# Patient Record
Sex: Male | Born: 1980 | Race: White | Hispanic: No | Marital: Single | Smoking: Current every day smoker
Health system: Southern US, Community
[De-identification: ages and names within clinical notes are randomized; demographics above are authoritative.]

## PROBLEM LIST (undated history)

## (undated) DIAGNOSIS — K5190 Ulcerative colitis, unspecified, without complications: Secondary | ICD-10-CM

## (undated) HISTORY — DX: Ulcerative colitis, unspecified, without complications: K51.90

---

## 2017-04-22 ENCOUNTER — Inpatient Hospital Stay (HOSPITAL_COMMUNITY)
Admission: AD | Admit: 2017-04-22 | Discharge: 2017-04-28 | DRG: 885 | Disposition: A | Discharge status: Home / Self Care | Payer: No Typology Code available for payment source | Source: Other Acute Inpatient Hospital | Attending: Psychiatry | Admitting: Psychiatry

## 2017-04-22 ENCOUNTER — Encounter (HOSPITAL_COMMUNITY): Payer: Self-pay

## 2017-04-22 DIAGNOSIS — F17210 Nicotine dependence, cigarettes, uncomplicated: Secondary | ICD-10-CM | POA: Diagnosis present

## 2017-04-22 DIAGNOSIS — Z79899 Other long term (current) drug therapy: Secondary | ICD-10-CM

## 2017-04-22 DIAGNOSIS — F410 Panic disorder [episodic paroxysmal anxiety] without agoraphobia: Secondary | ICD-10-CM | POA: Diagnosis present

## 2017-04-22 DIAGNOSIS — F322 Major depressive disorder, single episode, severe without psychotic features: Secondary | ICD-10-CM | POA: Diagnosis present

## 2017-04-22 DIAGNOSIS — R45851 Suicidal ideations: Secondary | ICD-10-CM | POA: Diagnosis present

## 2017-04-22 DIAGNOSIS — F333 Major depressive disorder, recurrent, severe with psychotic symptoms: Secondary | ICD-10-CM | POA: Diagnosis not present

## 2017-04-22 DIAGNOSIS — F19959 Other psychoactive substance use, unspecified with psychoactive substance-induced psychotic disorder, unspecified: Secondary | ICD-10-CM | POA: Diagnosis present

## 2017-04-22 DIAGNOSIS — F1994 Other psychoactive substance use, unspecified with psychoactive substance-induced mood disorder: Secondary | ICD-10-CM | POA: Diagnosis not present

## 2017-04-22 LAB — LIPID PANEL
CHOL/HDL RATIO: 4.3 ratio
Cholesterol: 169 mg/dL (ref 0–200)
HDL: 39 mg/dL — ABNORMAL LOW (ref >40)
LDL CALC: 96 mg/dL (ref 0–99)
Triglycerides: 171 mg/dL — ABNORMAL HIGH (ref <150)
VLDL: 34 mg/dL (ref 0–40)

## 2017-04-22 LAB — TSH: TSH: 8.997 u[IU]/mL — AB (ref 0.350–4.500)

## 2017-04-22 MED ORDER — ARIPIPRAZOLE 2 MG PO TABS
2.0000 mg | ORAL_TABLET | Freq: Every day | ORAL | Status: DC
  Administered 2017-04-22: 2 mg via ORAL
  Filled 2017-04-22 (×3): qty 1

## 2017-04-22 MED ORDER — LORAZEPAM 1 MG PO TABS
1.0000 mg | ORAL_TABLET | Freq: Every day | ORAL | Status: AC
  Administered 2017-04-25: 1 mg via ORAL
  Filled 2017-04-22: qty 1

## 2017-04-22 MED ORDER — LOPERAMIDE HCL 2 MG PO CAPS: 2.0000 mg | ORAL_CAPSULE | ORAL | Status: AC | PRN

## 2017-04-22 MED ORDER — ONDANSETRON 4 MG PO TBDP: 4.0000 mg | ORAL_TABLET | Freq: Four times a day (QID) | ORAL | Status: AC | PRN

## 2017-04-22 MED ORDER — ALUM & MAG HYDROXIDE-SIMETH 200-200-20 MG/5ML PO SUSP: 30.0000 mL | ORAL | Status: DC | PRN

## 2017-04-22 MED ORDER — SERTRALINE HCL 25 MG PO TABS
25.0000 mg | ORAL_TABLET | Freq: Every day | ORAL | Status: DC
  Administered 2017-04-22: 25 mg via ORAL
  Filled 2017-04-22 (×3): qty 1

## 2017-04-22 MED ORDER — ACETAMINOPHEN 325 MG PO TABS
650.0000 mg | ORAL_TABLET | Freq: Four times a day (QID) | ORAL | Status: DC | PRN
  Administered 2017-04-23: 650 mg via ORAL
  Filled 2017-04-22: qty 2

## 2017-04-22 MED ORDER — LORAZEPAM 1 MG PO TABS
1.0000 mg | ORAL_TABLET | Freq: Four times a day (QID) | ORAL | Status: AC
  Administered 2017-04-22 (×4): 1 mg via ORAL
  Filled 2017-04-22 (×3): qty 1

## 2017-04-22 MED ORDER — MAGNESIUM HYDROXIDE 400 MG/5ML PO SUSP: 30.0000 mL | Freq: Every day | ORAL | Status: DC | PRN

## 2017-04-22 MED ORDER — VITAMIN B-1 100 MG PO TABS
100.0000 mg | ORAL_TABLET | Freq: Every day | ORAL | Status: DC
  Administered 2017-04-23 – 2017-04-28 (×6): 100 mg via ORAL
  Filled 2017-04-22 (×8): qty 1

## 2017-04-22 MED ORDER — LORAZEPAM 1 MG PO TABS: 1.0000 mg | ORAL_TABLET | Freq: Three times a day (TID) | ORAL | Status: DC

## 2017-04-22 MED ORDER — LORAZEPAM 1 MG PO TABS
1.0000 mg | ORAL_TABLET | Freq: Four times a day (QID) | ORAL | Status: AC | PRN
  Administered 2017-04-22 – 2017-04-23 (×2): 1 mg via ORAL
  Filled 2017-04-22 (×3): qty 1

## 2017-04-22 MED ORDER — ADULT MULTIVITAMIN W/MINERALS CH
1.0000 | ORAL_TABLET | Freq: Every day | ORAL | Status: DC
  Administered 2017-04-22 – 2017-04-28 (×7): 1 via ORAL
  Filled 2017-04-22 (×11): qty 1

## 2017-04-22 MED ORDER — LORAZEPAM 1 MG PO TABS: 1.0000 mg | ORAL_TABLET | Freq: Every day | ORAL | Status: DC

## 2017-04-22 MED ORDER — SERTRALINE HCL 50 MG PO TABS
50.0000 mg | ORAL_TABLET | Freq: Every day | ORAL | Status: DC
  Administered 2017-04-23 – 2017-04-28 (×6): 50 mg via ORAL
  Filled 2017-04-22 (×2): qty 1
  Filled 2017-04-22: qty 7
  Filled 2017-04-22 (×5): qty 1

## 2017-04-22 MED ORDER — HYDROXYZINE HCL 25 MG PO TABS
25.0000 mg | ORAL_TABLET | Freq: Four times a day (QID) | ORAL | Status: DC | PRN
  Administered 2017-04-22 (×2): 25 mg via ORAL
  Filled 2017-04-22 (×2): qty 1

## 2017-04-22 MED ORDER — LORAZEPAM 1 MG PO TABS: 1.0000 mg | ORAL_TABLET | Freq: Two times a day (BID) | ORAL | Status: DC

## 2017-04-22 MED ORDER — LORAZEPAM 1 MG PO TABS: 1.0000 mg | ORAL_TABLET | Freq: Four times a day (QID) | ORAL | Status: DC

## 2017-04-22 MED ORDER — LORAZEPAM 1 MG PO TABS
1.0000 mg | ORAL_TABLET | Freq: Two times a day (BID) | ORAL | Status: AC
  Administered 2017-04-24 (×2): 1 mg via ORAL
  Filled 2017-04-22 (×3): qty 1

## 2017-04-22 MED ORDER — NICOTINE 21 MG/24HR TD PT24
21.0000 mg | MEDICATED_PATCH | Freq: Every day | TRANSDERMAL | Status: DC
  Administered 2017-04-23 – 2017-04-28 (×6): 21 mg via TRANSDERMAL
  Filled 2017-04-22 (×9): qty 1

## 2017-04-22 MED ORDER — LORAZEPAM 1 MG PO TABS
1.0000 mg | ORAL_TABLET | Freq: Three times a day (TID) | ORAL | Status: AC
  Administered 2017-04-23 (×3): 1 mg via ORAL
  Filled 2017-04-22 (×2): qty 1

## 2017-04-22 MED ORDER — ARIPIPRAZOLE 5 MG PO TABS
5.0000 mg | ORAL_TABLET | Freq: Every day | ORAL | Status: DC
  Administered 2017-04-23: 5 mg via ORAL
  Filled 2017-04-22 (×3): qty 1

## 2017-04-22 MED ORDER — TRAZODONE HCL 50 MG PO TABS
50.0000 mg | ORAL_TABLET | Freq: Every evening | ORAL | Status: DC | PRN
  Administered 2017-04-22 – 2017-04-23 (×3): 50 mg via ORAL
  Filled 2017-04-22 (×3): qty 1

## 2017-04-22 NOTE — BH Assessment (Signed)
Tele Assessment Note   Carl Mcdaniel is an 36 y.o. male, African American Patient states primary concern is he needs help but has not really sought help before. Patient states depression has become worse and is waking up with no sense of purpose or will to keep going. Patient when asked about SI  plans stated that he just had not had the right tools. Patient also has hx. of multiple stressors/trauma including a friend x 2 years ago that commited suicide. Patient was paranoid apparently at first during assessment, then agreed to continue and was standing outside room door and not very still when asked did state he has had a a lot happen to him in the past and childhood hx. of depression as well. Patient acknowledges current SI but no specific plan although did make statements such as  "I feel like I am done." Patient also states recent panic and anxiety attacks last x 1 week ago random in nature. Patient denies current HI and AVH. Patient denies hx. of inpatient psych care and is not seen outpatient.  Patient was dressed in scrubs speech appeared normal, movement was restless and thought process was clear with recent and remote memory intact. Patient did not appear to be responding to internal stimuli. Patient was cooperative and states he is agreeable to inpatient psych care. Patient does states hx. of S.A. with alcohol first use at age 57 x 2, since 2015/16 has started drinking more daily with 12 pack or more last consumption today with 4 beers or more.Patient denies hx. fo DT' s and/or seizures.   Diagnosis: Major Depressive Disorder, Alcohol Use Disorder, Severe  Past Medical History: History reviewed. No pertinent past medical history.  History reviewed. No pertinent surgical history.  Family History: History reviewed. No pertinent family history.  Social History:  reports that he has been smoking Cigarettes.  He has been smoking about 1.50 packs per day. He has never used smokeless tobacco.  He reports that he drinks alcohol. He reports that he uses drugs, including Amphetamines.  Additional Social History:  Alcohol / Drug Use Pain Medications: SEE MAR Prescriptions: SEE MAR Over the Counter: SEE MAR History of alcohol / drug use?: Yes Longest period of sobriety (when/how long): unknown Negative Consequences of Use: Financial, Legal, Personal relationships Withdrawal Symptoms: Patient aware of relationship between substance abuse and physical/medical complications Substance #1 Name of Substance 1: alcohol 1 - Age of First Use: unspecified 1 - Amount (size/oz): 12 pack beer or more 1 - Frequency: daily 1 - Duration: years 1 - Last Use / Amount: 04/21/17 4 beers or more  CIWA: CIWA-Ar BP: 126/77 Pulse Rate: 96 Nausea and Vomiting: no nausea and no vomiting Tactile Disturbances: none Tremor: two Auditory Disturbances: moderately severe hallucinations Paroxysmal Sweats: no sweat visible Visual Disturbances: moderate sensitivity Anxiety: two Headache, Fullness in Head: none present Agitation: somewhat more than normal activity Orientation and Clouding of Sensorium: cannot do serial additions or is uncertain about date CIWA-Ar Total: 13 COWS:    PATIENT STRENGTHS: (choose at least two) Active sense of humor Average or above average intelligence  Allergies:  Allergies  Allergen Reactions  . Other     Shell fish    Home Medications:  No prescriptions prior to admission.    OB/GYN Status:  No LMP for male patient.  General Assessment Data [facility] of Assessment[facility] TTS Assessment: Out of system Is this a Tele or Face-to-Face Assessment?: Tele Assessment Is this an Initial Assessment or  a Re-assessment for this encounter?: Initial Assessment Marital status: Single Maiden name: n/a Is patient pregnant?: No Pregnancy Status: No Living Arrangements: Other (Comment) (pt. states homeless but currently resides with friends) Can pt return  to current living arrangement?: Yes Admission Status: Voluntary Is patient capable of signing voluntary admission?: Yes Referral Source: Other Insurance type: Medicaid     Crisis Care Plan Living Arrangements: Other (Comment) (pt. states homeless but currently resides with friends) Name of Psychiatrist: none Name of Therapist: none  Education Status Is patient currently in school?: No Current Grade: n/a Highest grade of school patient has completed: 10th Name of school: n/a Contact person: none given  Risk to self with the past 6 months Suicidal Ideation: Yes-Currently Present Has patient been a risk to self within the past 6 months prior to admission? : No Suicidal Intent: No Has patient had any suicidal intent within the past 6 months prior to admission? : No Is patient at risk for suicide?: Yes Suicidal Plan?: No Has patient had any suicidal plan within the past 6 months prior to admission? : No Access to Means: No What has been your use of drugs/alcohol within the last 12 months?: alcohol Previous Attempts/Gestures: No How many times?: 0 Other Self Harm Risks: none  Triggers for Past Attempts: Unknown Intentional Self Injurious Behavior: None Family Suicide History: No Recent stressful life event(s): Turmoil (Comment) Persecutory voices/beliefs?: No Depression: Yes Depression Symptoms: Insomnia, Tearfulness, Isolating, Fatigue, Guilt, Loss of interest in usual pleasures, Feeling worthless/self pity Substance abuse history and/or treatment for substance abuse?: Yes Suicide prevention information given to non-admitted patients: Yes  Risk to Others within the past 6 months Homicidal Ideation: No Does patient have any lifetime risk of violence toward others beyond the six months prior to admission? : No Thoughts of Harm to Others: No Current Homicidal Intent: No Current Homicidal Plan: No Access to Homicidal Means: No Identified Victim: none History of harm to  others?: No Assessment of Violence: None Noted Violent Behavior Description: n/a Does patient have access to weapons?: No Criminal Charges Pending?: No Does patient have a court date: No Is patient on probation?: No  Psychosis Hallucinations: None noted Delusions: None noted  Mental Status Report Appearance/Hygiene: In scrubs Eye Contact: Fair Motor Activity: Restlessness Speech: Logical/coherent Level of Consciousness: Alert Mood: Anxious Affect: Depressed Anxiety Level: Moderate Thought Processes: Relevant Judgement: Impaired Orientation: Person, Place, Time, Situation, Appropriate for developmental age Obsessive Compulsive Thoughts/Behaviors: Moderate  Cognitive Functioning Concentration: Normal Memory: Recent Intact, Remote Intact IQ: Average Insight: Fair Impulse Control: Poor Appetite: Fair Weight Loss: 0 Weight Gain: 0 Sleep: Decreased Total Hours of Sleep: 4 Vegetative Symptoms: None  ADLScreening [facility] Assessment [facility]) Patient's cognitive ability adequate to safely complete daily activities?: Yes Patient able to express need for assistance with ADLs?: Yes Independently performs ADLs?: Yes (appropriate for developmental age)  Prior Inpatient Therapy Prior Inpatient Therapy: No Prior Therapy Dates: n/a Prior Therapy Facilty/Provider(s): n/a Reason for Treatment: n/a  Prior Outpatient Therapy Prior Outpatient Therapy: No Prior Therapy Dates: n/a Prior Therapy Facilty/Provider(s): n/a Reason for Treatment: n/a Does patient have an ACCT team?: No Does patient have Intensive In-House Services?  : No Does patient have [facility] services? : No Does patient have P4CC services?: No  ADL Screening (condition at time of admission) Patient's cognitive ability adequate to safely complete daily activities?: Yes Is the patient deaf or have difficulty hearing?: No Does the patient have difficulty seeing, even when wearing glasses/contacts?: No Does the patient  have  difficulty concentrating, remembering, or making decisions?: No Patient able to express need for assistance with ADLs?: Yes Does the patient have difficulty dressing or bathing?: No Independently performs ADLs?: Yes (appropriate for developmental age) Does the patient have difficulty walking or climbing stairs?: No Weakness of Legs: None Weakness of Arms/Hands: None  Home Assistive Devices/Equipment Home Assistive Devices/Equipment: None  Therapy Consults (therapy consults require a physician order) PT Evaluation Needed: No OT Evalulation Needed: No SLP Evaluation Needed: No Abuse/Neglect Assessment (Assessment to be complete while patient is alone) Physical Abuse: Denies Verbal Abuse: Denies Sexual Abuse: Denies Exploitation of patient/patient's resources: Denies Self-Neglect: Denies Values / Beliefs Cultural Requests During Hospitalization: None Spiritual Requests During Hospitalization: None Consults Spiritual Care Consult Needed: No Social Work Consult Needed: No Merchant navy officerAdvance Directives (For Healthcare) Does Patient Have a Medical Advance Directive?: No Would patient like information on creating a medical advance directive?: No - Patient declined Nutrition Screen- [facility] Adult/[facility]/AP Patient's home diet: Regular, Full liquid Has the patient recently lost weight without trying?: No Has the patient been eating poorly because of a decreased appetite?: No Malnutrition Screening Tool Score: 0  Additional Information 1:1 In Past 12 Months?: No CIRT Risk: No Elopement Risk: No Does patient have medical clearance?: Yes     Disposition: Per Nira ConnJason Berry, NP meets inpatient criteria Disposition Initial Assessment Completed for this Encounter: Yes Disposition of Patient: Inpatient treatment program Type of inpatient treatment program: Adult  Hipolito BayleyShean K Kathrynne Kulinski 04/22/2017 3:39 AM

## 2017-04-22 NOTE — [facility] Group Notes (Signed)
[facility] LCSW Group Therapy Note  04/22/2017 at 10:15 AM  Type of Therapy and Topic:  Group Therapy: Avoiding Self-Sabotaging and Enabling Behaviors  Participation Level:  Did Not Attend; invited to participate yet did not despite overhead announcement and encouragement by staff    Joannah Gitlin C Tim Corriher, LCSW 

## 2017-04-22 NOTE — H&P (Signed)
Psychiatric Admission Assessment Adult  Patient Identification: Carl Mcdaniel MRN:  [id number] Date of Evaluation:  04/22/2017 Chief Complaint:  MDD ETOH USE DISORDER,SEV COCAINE USE DISORDER Principal Diagnosis: Severe episode of recurrent major depressive disorder, with psychotic features (HCC) Diagnosis:   Patient Active Problem List   Diagnosis Date Noted  . Severe major depression without psychotic features (HCC) [F32.2] 04/22/2017  . Severe episode of recurrent major depressive disorder, with psychotic features (HCC) [F33.3]    History of Present Illness:Per tele- assessment note-Carl Mcdaniel is an 36 y.o. male, African American Patient states primary concern is he needs help but has not really sought help before. Patient states depression has become worse and is waking up with no sense of purpose or will to keep going. Patient when asked about SI  plans stated that he just had not had the right tools. Patient also has hx. of multiple stressors/trauma including a friend x 2 years ago that commited suicide. Patient was paranoid apparently at first during assessment, then agreed to continue and was standing outside room door and not very still when asked did state he has had a a lot happen to him in the past and childhood hx. of depression as well. Patient acknowledges current SI but no specific plan although did make statements such as  "I feel like I am done." Patient also states recent panic and anxiety attacks last x 1 week ago random in nature. Patient denies current HI and AVH. Patient denies hx. of inpatient psych care and is not seen outpatient.  Patient was dressed in scrubs speech appeared normal, movement was restless and thought process was clear with recent and remote memory intact. Patient did not appear to be responding to internal stimuli. Patient was cooperative and states he is agreeable to inpatient psych care. Patient does states hx. of S.A. with alcohol first use at  age 61 x 2, since 2015/16 has started drinking more daily with 12 pack or more last consumption today with 4 beers or more.Patient denies hx. fo DT' s and/or seizures Associated Signs/Symptoms: Depression Symptoms:  depressed mood, (Hypo) Manic Symptoms:  Distractibility, Anxiety Symptoms:  Excessive Worry, Social Anxiety, Psychotic Symptoms:  Hallucinations: Auditory Visual PTSD Symptoms: Hypervigilance:  Yes Total Time spent with patient: 30 minutes  Past Psychiatric History:   Is the patient at risk to self? Yes.    Has the patient been a risk to self in the past 6 months? Yes.    Has the patient been a risk to self within the distant past? Yes.    Is the patient a risk to others? No.  Has the patient been a risk to others in the past 6 months? No.  Has the patient been a risk to others within the distant past? No.   Prior Inpatient Therapy: Prior Inpatient Therapy: No Prior Therapy Dates: n/a Prior Therapy Facilty/Provider(s): n/a Reason for Treatment: n/a Prior Outpatient Therapy: Prior Outpatient Therapy: No Prior Therapy Dates: n/a Prior Therapy Facilty/Provider(s): n/a Reason for Treatment: n/a Does patient have an ACCT team?: No Does patient have Intensive In-House Services?  : No Does patient have [facility] services? : No Does patient have P4CC services?: No  Alcohol Screening: 1. How often do you have a drink containing alcohol?: 4 or more times a week 2. How many drinks containing alcohol do you have on a typical day when you are drinking?: 3 or 4 3. How often do you have six or more drinks on one occasion?: Daily  or almost daily Preliminary Score: 5 4. How often during the last year have you found that you were not able to stop drinking once you had started?: Daily or almost daily 5. How often during the last year have you failed to do what was normally expected from you becasue of drinking?: Never 6. How often during the last year have you needed a first drink in  the morning to get yourself going after a heavy drinking session?: Less than monthly 7. How often during the last year have you had a feeling of guilt of remorse after drinking?: Daily or almost daily 8. How often during the last year have you been unable to remember what happened the night before because you had been drinking?: Never 9. Have you or someone else been injured as a result of your drinking?: No 10. Has a relative or friend or a doctor or another health worker been concerned about your drinking or suggested you cut down?: No Alcohol Use Disorder Identification Test Final Score (AUDIT): 18 Substance Abuse History in the last 12 months:  Yes.   Consequences of Substance Abuse: NA Previous Psychotropic Medications: YES Psychological Evaluations: YES Past Medical History: History reviewed. No pertinent past medical history. History reviewed. No pertinent surgical history. Family History: History reviewed. No pertinent family history. Family Psychiatric  History:  Tobacco Screening: Have you used any form of tobacco in the last 30 days? (Cigarettes, Smokeless Tobacco, Cigars, and/or Pipes): Yes Tobacco use, Select all that apply: 5 or more cigarettes per day Are you interested in Tobacco Cessation Medications?: No, patient refused Counseled patient on smoking cessation including recognizing danger situations, developing coping skills and basic information about quitting provided: Refused/Declined practical counseling Social History:  History  Alcohol Use  . Yes     History  Drug Use  . Types: Amphetamines    Additional Social History: Marital status: Single Are you sexually active?: No What is your sexual orientation?: Unknown Has your sexual activity been affected by drugs, alcohol, medication, or emotional stress?: Unknown Does patient have children?: No    Pain Medications: SEE MAR Prescriptions: SEE MAR Over the Counter: SEE MAR History of alcohol / drug use?:  Yes Longest period of sobriety (when/how long): unknown Negative Consequences of Use: Financial, Legal, Personal relationships Withdrawal Symptoms: Patient aware of relationship between substance abuse and physical/medical complications Name of Substance 1: alcohol 1 - Age of First Use: unspecified 1 - Amount (size/oz): 12 pack beer or more 1 - Frequency: daily 1 - Duration: years 1 - Last Use / Amount: 04/21/17 4 beers or more                  Allergies:   Allergies  Allergen Reactions  . Other     Shell fish   Lab Results:  Results for orders placed or performed during the hospital encounter of 04/22/17 (from the past 48 hour(s))  Lipid panel     Status: Abnormal   Collection Time: 04/22/17  6:26 AM  Result Value Ref Range   Cholesterol 169 0 - 200 mg/dL   Triglycerides 161 (H) <150 mg/dL   HDL 39 (L) >09 mg/dL   Total CHOL/HDL Ratio 4.3 RATIO   VLDL 34 0 - 40 mg/dL   LDL Cholesterol 96 0 - 99 mg/dL    Comment:        Total Cholesterol/HDL:CHD Risk Coronary Heart Disease Risk Table  Men   Women  1/2 Average Risk   3.4   3.3  Average Risk       5.0   4.4  2 X Average Risk   9.6   7.1  3 X Average Risk  23.4   11.0        Use the calculated Patient Ratio above and the CHD Risk Table to determine the patient's CHD Risk.        ATP III CLASSIFICATION (LDL):  <100     mg/dL   Optimal  [id number]  mg/dL   Near or Above                    Optimal  130-159  mg/dL   Borderline  [id number]  mg/dL   High  >295>190     mg/dL   Very High Performed at [facility], [address] [place], [place] [address]   TSH     Status: Abnormal   Collection Time: 04/22/17  6:26 AM  Result Value Ref Range   TSH 8.997 (H) 0.350 - 4.500 uIU/mL    Comment: Performed by a 3rd Generation assay with a functional sensitivity of <=0.01 uIU/mL. Performed at [facility], [address] [place], [place] [address]     Blood Alcohol level:  No results  found for: [facility]  Metabolic Disorder Labs:  No results found for: HGBA1C, MPG No results found for: PROLACTIN Lab Results  Component Value Date   CHOL 169 04/22/2017   TRIG 171 (H) 04/22/2017   HDL 39 (L) 04/22/2017   CHOLHDL 4.3 04/22/2017   VLDL 34 04/22/2017   LDLCALC 96 04/22/2017    Current Medications: Current Facility-Administered Medications  Medication Dose Route Frequency Provider Last Rate Last Dose  . acetaminophen (TYLENOL) tablet 650 mg  650 mg Oral Q6H PRN Jackelyn PolingBerry, Jason A, NP      . alum & mag hydroxide-simeth (MAALOX/MYLANTA) 200-200-20 MG/5ML suspension 30 mL  30 mL Oral Q4H PRN Jackelyn PolingBerry, Jason A, NP      . Melene Muller[START ON 04/23/2017] ARIPiprazole (ABILIFY) tablet 5 mg  5 mg Oral Daily [name], Fernando A, MD      . hydrOXYzine (ATARAX/VISTARIL) tablet 25 mg  25 mg Oral Q6H PRN Nira ConnBerry, Jason A, NP   25 mg at 04/22/17 0148  . loperamide (IMODIUM) capsule 2-4 mg  2-4 mg Oral PRN Nira ConnBerry, Jason A, NP      . LORazepam (ATIVAN) tablet 1 mg  1 mg Oral Q6H PRN Nira ConnBerry, Jason A, NP   1 mg at 04/22/17 0148  . [START ON 04/24/2017] LORazepam (ATIVAN) tablet 1 mg  1 mg Oral BID Jackelyn PolingBerry, Jason A, NP       Followed by  . LORazepam (ATIVAN) tablet 1 mg  1 mg Oral QID Nira ConnBerry, Jason A, NP   1 mg at 04/22/17 1502   Followed by  . [START ON 04/23/2017] LORazepam (ATIVAN) tablet 1 mg  1 mg Oral TID Jackelyn PolingBerry, Jason A, NP       Followed by  . [START ON 04/25/2017] LORazepam (ATIVAN) tablet 1 mg  1 mg Oral Daily Nira ConnBerry, Jason A, NP      . magnesium hydroxide (MILK OF MAGNESIA) suspension 30 mL  30 mL Oral Daily PRN Nira ConnBerry, Jason A, NP      . multivitamin with minerals tablet 1 tablet  1 tablet Oral Daily Nira ConnBerry, Jason A, NP   1 tablet at 04/22/17 1041  . ondansetron (  ZOFRAN-ODT) disintegrating tablet 4 mg  4 mg Oral Q6H PRN [name], NP      . Melene Muller ON 04/23/2017] sertraline (ZOLOFT) tablet 50 mg  50 mg Oral Daily [name], [name], MD      . Melene Muller ON 04/23/2017] thiamine (VITAMIN B-1) tablet 100 mg  100 mg Oral  Daily [name], NP      . traZODone (DESYREL) tablet 50 mg  50 mg Oral QHS PRN [name], NP   50 mg at 04/22/17 0148   PTA Medications: Prescriptions Prior to Admission  Medication Sig Dispense Refill Last Dose  . Aspirin-Acetaminophen (GOODYS BODY PAIN PO) Take 1 tablet by mouth every 6 (six) hours as needed (back pain).   Past Month at Unknown time    Musculoskeletal: Strength & Muscle Tone: within normal limits Gait & Station: normal Patient leans: N/A  Psychiatric Specialty Exam: Physical Exam  Nursing note and vitals reviewed. Constitutional: He appears well-developed.  Cardiovascular: Normal rate.   Psychiatric: He has a normal mood and affect. His behavior is normal.    Review of Systems  Psychiatric/Behavioral: Positive for depression, hallucinations and substance abuse.    Blood pressure 122/84, pulse (!) 102, temperature 98.3 F (36.8 C), temperature source Oral, resp. rate 16, height 6' (1.829 m), weight 80.3 kg (177 lb).Body mass index is 24.01 kg/m.  General Appearance: Casual  Eye Contact:  Fair  Speech:  Clear and Coherent  Volume:  Decreased  Mood:  Anxious, Depressed and Irritable  Affect:  Constricted and Flat  Thought Process:  Descriptions of Associations: Loose  Orientation:  Full (Time, Place, and Person)  Thought Content:  Paranoid Ideation  Suicidal Thoughts:  No  Homicidal Thoughts:  No  Memory:  Immediate;   Fair Recent;   Fair Remote;   Fair  Judgement:  Fair  Insight:  Lacking  Psychomotor Activity:  Restlessness  Concentration:  Concentration: Fair  Recall:  [employer] of Knowledge:  Fair  Language:  Fair  Akathisia:  No  Handed:  Right  AIMS (if indicated):     Assets:  Communication Skills Desire for Improvement  ADL's:  Intact  Cognition:  WNL  Sleep:  Number of Hours: 3.25     I agree with current treatment plan on 04/22/2017, Patient seen face-to-face for psychiatric evaluation follow-up, chart reviewed. Reviewed  the information documented and agree with the treatment plan.  Treatment Plan Summary: Daily contact with patient to assess and evaluate symptoms and progress in treatment and Medication management   Continue with Abilify 5 mg, Zoloft 50mg  for mood stabilization. Continue with Trazodone 50 mg for insomnia Started on CWIA/ AtivanProtocol Will continue to monitor vitals ,medication compliance and treatment side effects while patient is here.  Reviewed labs:,BAL - , UDS - . CSW will start working on disposition.  Patient to participate in therapeutic milieu  Observation Level/Precautions:  15 minute checks  Laboratory:  CBC Chemistry Profile UDS UA  Psychotherapy:  individual and group session   Medications:  See above  Consultations:  psychiatry  Discharge Concerns:  5-7ays  Estimated FAO:ZHYQMV, stabilization, and risk of access to medication and medication stabilization   Other:     Physician Treatment Plan for Primary Diagnosis: Severe episode of recurrent major depressive disorder, with psychotic features (HCC) Long Term Goal(s): Improvement in symptoms so as ready for discharge  Short Term Goals: Ability to identify changes in lifestyle to reduce recurrence of condition will improve, Ability to verbalize feelings  will improve, Ability to disclose and discuss suicidal ideas and Ability to identify and develop effective coping behaviors will improve  Physician Treatment Plan for Secondary Diagnosis: Principal Problem:   Severe episode of recurrent major depressive disorder, with psychotic features (HCC) Active Problems:   Severe major depression without psychotic features (HCC)  Long Term Goal(s): Improvement in symptoms so as ready for discharge  Short Term Goals: Ability to identify and develop effective coping behaviors will improve, Ability to maintain clinical measurements within normal limits will improve and Ability to identify triggers associated with substance  abuse/mental health issues will improve  I certify that inpatient services furnished can reasonably be expected to improve the patient's condition.    [name], NP 5/19/20185:58 PM

## 2017-04-22 NOTE — Progress Notes (Signed)
Pt is a 36 year old male admitted with depression and suicidal ideation   He lso has polysubstance abuse  And admits to hearing voices telling him to harm himself   He is not forthcoming with information and said he doesn't want to talk  He did sign his papers and was cooperative with the search but otherwise would not supply any other information   He is homeless   Oriented to the unit   Discussed drug and ETOH withdrawal   Provided nourishment   Medications administered and effectiveness monitored   Q 15 min checks explained and implemented  Verbal support and encouragement given    Pt is safe and verbalized understanding of ETOH withdrawal but denies he has a problem  He said he drinks to fall asleep and help with pain
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
## 2017-04-22 NOTE — Progress Notes (Signed)
Data. Patient has spent the entire day in his room. Did not go to meals or down to the gym. Patient's conversation today was very paranoid and delusional. Patient made many statements, such as, "I don't know who to trust. Just when I get to trusting someone the devil get to them and they become the devil's desciples." Patient also reported. "I have to get out of here. I have to get away from the devil." When asked to clarify his statement, patient replied, " I have to get out of this body, so the devil can't find me. I think he is in heaven too." Patient continued to talk about not trusting others because of the devil and how, "The devil will find my. I can't hide forever." Patient also stated that, "The devil talks to me sometimes and tells me terrible things." Affect is worries and anxious. Patient was able to verbally contract to come to staff, before acting on any of his IS or desires to, "Leave my body".  Action. Emotional support and encouragement offered. Education provided on medication, indications and side effect. Q 15 minute checks done for safety. Response. Safety on the unit maintained through 15 minute checks.  Medications taken as prescribed.

## 2017-04-22 NOTE — [facility] Suicide Risk Assessment (Signed)
[facility]   Nursing information obtained from:   patient and chart  Demographic factors:   36 year old , single, no children, lives with a roommate  Current Mental Status:   see below Loss Factors:    Historical Factors:    Risk Reduction Factors:     Total Time spent with patient: 45 minutes Principal Problem: <principal problem not specified> Diagnosis:   Patient Active Problem List   Diagnosis Date Noted  . 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  World Science writerHealth Organization [facility](WHO). Score between 0-7:  no or low risk or alcohol related problems. Score between 8-15:  moderate risk of alcohol related problems. Score between 16-19:  high risk of alcohol related problems. Score 20 or above:  warrants further diagnostic evaluation for alcohol dependence and treatment.   CLINICAL FACTORS:  36 year old male, reports " I have been depressed for a long time, years ", but states he has been feeling worse lately. States that " there is something going on that is making it worse".  States he has had some suicidal ideations, with no specific plan or intention. Endorses neuro-vegetative symptoms of depression and also presents with psychotic symptoms. Patient presents paranoid, and states " I cannot tell you what it is, because there is always someone listening, you cannot trust a lot of people". He also endorses hallucinations, and states he has been hearing voices telling him " we are going to get you".States he has been experiencing these for a few years, intermittently, but worsening recently. He was not taking any medications prior to admission.   Reports neuro-vegetative symptoms of depression , to include poor sleep, poor appetite,  poor energy level , anhedonia, suicidal ideations.  States he drinks 4 -6 beers per day, 12 beers per day on weekends , smokes cannabis daily.  No prior psychiatric admissions, has never attempted suicide . Reports history of  depression, psychosis as above . Denies history of violence. Denies history of mania, hypomania. States he took Zoloft years ago, and does remember he felt better .   Denies medical illnesses, NKDA.  Dx- MDD, with psychotic features, consider also substance induced mood disorder, Alcohol Use Disorder , Cannabis Use Disorder   Plan- inpatient admission Continue Ativan detox protocol to minimize risk of alcohol WDL Start Zoloft at 50 mgrs QDAY  Start Abilify 5 mgrs QDAY  Start Trazodone 50 mgrs QHS PRN for insomnia Check FT3, FT4, to follow up on elevated TSH.        Musculoskeletal: Strength & Muscle Tone: within normal limits Gait & Station: normal Patient leans: N/A  Psychiatric Specialty Exam: Physical Exam  ROS  Blood pressure 122/84, pulse (!) 102, temperature 98.3 F (36.8 C), temperature source Oral, resp. rate 16, height 6' (1.829 m), weight 80.3 kg (177 lb).Body mass index is 24.01 kg/m.  General Appearance: Fairly Groomed  Eye Contact:  Fair  Speech:  Normal Rate  Volume:  Decreased  Mood:  depressed, sad  Affect:  constricted, and vaguely guarded   Thought Process:  Linear and Descriptions of Associations: Intact  Orientation:  Full (Time, Place, and Person)  Thought Content:  reports auditory hallucinations,does not appear internally preoccupied at this time , presents with paranoid ideations, and states there are issues he cannot speak  about because he fears people may be listening  Suicidal Thoughts:  No denies any current suicidal plan or intention and contracts for safety on unit   Homicidal Thoughts:  No  Memory:  recent and remote grossly intact   Judgement:  Fair  Insight:  Fair  Psychomotor Activity:  Decreased  Concentration:   Concentration: Good and Attention Span: Good  Recall:  Good  Fund of Knowledge:  Good  Language:  Good  Akathisia:  Negative  Handed:  Right  AIMS (if indicated):     Assets:  Desire for Improvement Resilience  ADL's:  Intact  Cognition:  WNL  Sleep:  Number of Hours: 3.25      COGNITIVE FEATURES THAT CONTRIBUTE TO RISK:  Closed-mindedness and Loss of executive function    SUICIDE RISK:   Moderate:  Frequent suicidal ideation with limited intensity, and duration, some specificity in terms of plans, no associated intent, good self-control, limited dysphoria/symptomatology, some risk factors present, and identifiable protective factors, including available and accessible social support.  PLAN OF CARE: Patient will be admitted to inpatient psychiatric unit for stabilization and safety. Will provide and encourage milieu participation. Provide medication management and maked adjustments as needed. Will also provide medication management to minimize risk of alcohol WDL.  Will follow daily.    I certify that inpatient services furnished can reasonably be expected to improve the patient's condition.   Craige Cotta, MD 04/22/2017, 5:27 PM

## 2017-04-22 NOTE — Tx Team (Signed)
Initial Treatment Plan 04/22/2017 3:52 AM [name] ZOX:[id number]    PATIENT STRESSORS: Financial difficulties Medication change or noncompliance Occupational concerns Substance abuse   PATIENT STRENGTHS: Capable of independent living General fund of knowledge   PATIENT IDENTIFIED PROBLEMS: Need help to find a place to live"  I am depressed and I hear voices"                   DISCHARGE CRITERIA:  Improved stabilization in mood, thinking, and/or behavior Need for constant or close observation no longer present Verbal commitment to aftercare and medication compliance  PRELIMINARY DISCHARGE PLAN: Attend aftercare/continuing care group Attend 12-step recovery group Placement in alternative living arrangements  PATIENT/FAMILY INVOLVEMENT: This treatment plan has been presented to and reviewed with the patient, Carl DikeClinton E Vowles, and/or family member, .  The patient and family have been given the opportunity to ask questions and make suggestions.  Andrena Mewsuttall, Melven Stockard J, RN 04/22/2017, 3:52 AM

## 2017-04-23 LAB — HEMOGLOBIN A1C
Hgb A1c MFr Bld: 5.9 % — ABNORMAL HIGH (ref 4.8–5.6)
Mean Plasma Glucose: 123 mg/dL

## 2017-04-23 LAB — T4, FREE: Free T4: 1.11 ng/dL (ref 0.61–1.12)

## 2017-04-23 MED ORDER — ARIPIPRAZOLE 10 MG PO TABS
10.0000 mg | ORAL_TABLET | Freq: Every day | ORAL | Status: DC
  Administered 2017-04-24 – 2017-04-25 (×2): 10 mg via ORAL
  Filled 2017-04-23 (×4): qty 1

## 2017-04-23 NOTE — Progress Notes (Signed)
D: Patient continues to be guarded and paranoid. Isolative but attended group tonight. Continues to endorse AH, passive SI, verbally contracted for safety. Patient stated "voice wouldn't let me sleep". Denies pain at this time. Will continue to monitor patient.   A: Staff offered support and encouragement as needed. Med given as ordered. Routine safety checks maintained. Will continue to monitor patient.   R: Patient remains safe on unit.

## 2017-04-23 NOTE — Progress Notes (Cosign Needed)
Adult Psychoeducational Group Note  Date:  04/23/2017 Time:  1:08 AM  Group Topic/Focus:  Wrap-Up Group:   The focus of this group is to help patients review their daily goal of treatment and discuss progress on daily workbooks.  Participation Level:  Did Not Attend  Participation Quality:  Patient did not attend  Affect:  Patient did not attend  Cognitive:  Patient did not attend  Insight: None  Engagement in Group:  Patient did not attend  Modes of Intervention:  Patient did not attend  Additional Comments:  Patient did not attend evening wrap up group.  Carl FurnaceChristopher  [name] 04/23/2017, 1:08 AM

## 2017-04-23 NOTE — [facility] Group Notes (Deleted)
[facility] LCSW Group Therapy  04/23/2017 10:00 AM  Type of Therapy:  Group Therapy  Participation Level:  Did Not Attend; invited to participate yet did not despite overhead announcement and encouragement by staff   Summary of Progress/Problems: Topic for today was thoughts and feelings regarding discharge. We discussed fears of upcoming changes including judgements, expectations and stigma of mental health issues. We then discussed supports: what constitutes a supportive framework, identification of supports and what to do when others are not supportive. Patients then identified a specific coping tool to use when others are not available.    Daelon Dunivan C Lauretta Sallas, LCSW      

## 2017-04-23 NOTE — [facility] Group Notes (Signed)
[facility] Group Notes:  Identifying Primary and Secondary Emotions  Date:  04/23/2017  Time:  3:09 PM  Type of Therapy:  Psychoeducational Skills  Participation Level:  Did Not Attend  Participation Quality:  N/A  Affect:  N/A  Cognitive:  N/A  Insight:  None  Engagement in Group:  None  Modes of Intervention:  Discussion and Education  Summary of Progress/Problems:  Patient was invited to group but did not attend.   Fathima Bartl E 04/23/2017, 3:09 PM 

## 2017-04-23 NOTE — [facility] Group Notes (Signed)
  [facility] LCSW Group Therapy  04/23/2017 10:00 AM  Type of Therapy:  Group Therapy  Participation Level:  Minimal  Participation Quality:  Attentive and Sharing  Affect:  Depressed and Flat  Cognitive:  Alert and Oriented  Insight:  Limited  Engagement in Therapy:  Limited  Modes of Intervention:  Discussion, Exploration, Rapport Building, Socialization and Support  Summary of Progress/Problems: Topic for today was thoughts and feelings regarding discharge. We discussed fears of upcoming changes including judgements, expectations and stigma of mental health issues. We then discussed supports: what constitutes a supportive framework, identification of supports and what to do when others are not supportive. Patients then identified a specific coping tool to use when others are not available. Patient reports he usually shuts down in order to cope; we later changed his wording to reflect what he does with 'detaching from my feelings.'  He is appreciative of supports who show understanding.    Carney Bernatherine C Allayah Raineri, LCSW

## 2017-04-23 NOTE — Progress Notes (Signed)
[facility]  04/23/2017 1:42 PM Carl Mcdaniel  MRN:  [id number]   Subjective:  Patient reports " the voice in my head still come and go"  Objective: Carl Mcdaniel is awake, alert and oriented *3.  Denies suicidal or homicidal ideation. Reports auditory hallucination reports the voice sometime tells him to hurt his self " and that  I am no good."  Patient continues to be isolative and guarded. Patient reports he is medication compliant without mediation side effects. Patient reports a better night sleep. ( states the voice are mild) Support, encouragement and reassurance was provided.    Principal Problem: Severe episode of recurrent major depressive disorder, with psychotic features (HCC) Diagnosis:   Patient Active Problem List   Diagnosis Date Noted  . Severe major depression without psychotic features (HCC) [F32.2] 04/22/2017  . Severe episode of recurrent major depressive disorder, with psychotic features (HCC) [F33.3]    Total Time spent with patient: 30 minutes  Past Psychiatric History:   Past Medical History: History reviewed. No pertinent past medical history. History reviewed. No pertinent surgical history. Family History: History reviewed. No pertinent family history. Family Psychiatric  History:  Social History:  History  Alcohol Use  . Yes     History  Drug Use  . Types: Amphetamines    Social History   Social History  . Marital status: Single    Spouse name: N/A  . Number of children: N/A  . Years of education: N/A   Social History Main Topics  . Smoking status: Current Every Day Smoker    Packs/day: 1.50    Types: Cigarettes  . Smokeless tobacco: Never Used  . Alcohol use Yes  . Drug use: Yes    Types: Amphetamines  . Sexual activity: Not Asked   Other Topics Concern  . None   Social History Narrative  . None   Additional Social History:    Pain Medications: SEE MAR Prescriptions: SEE MAR Over the Counter: SEE MAR History of  alcohol / drug use?: Yes Longest period of sobriety (when/how long): unknown Negative Consequences of Use: Financial, Legal, Personal relationships Withdrawal Symptoms: Patient aware of relationship between substance abuse and physical/medical complications Name of Substance 1: alcohol 1 - Age of First Use: unspecified 1 - Amount (size/oz): 12 pack beer or more 1 - Frequency: daily 1 - Duration: years 1 - Last Use / Amount: 04/21/17 4 beers or more                  Sleep: Fair  Appetite:  Fair  Current Medications: Current Facility-Administered Medications  Medication Dose Route Frequency Provider Last Rate Last Dose  . acetaminophen (TYLENOL) tablet 650 mg  650 mg Oral Q6H PRN Nira ConnBerry, Jason A, NP   650 mg at 04/23/17 0921  . alum & mag hydroxide-simeth (MAALOX/MYLANTA) 200-200-20 MG/5ML suspension 30 mL  30 mL Oral Q4H PRN Nira ConnBerry, Jason A, NP      . ARIPiprazole (ABILIFY) tablet 5 mg  5 mg Oral Daily [name], Rockey SituFernando A, MD   5 mg at 04/23/17 0918  . hydrOXYzine (ATARAX/VISTARIL) tablet 25 mg  25 mg Oral Q6H PRN Nira ConnBerry, Jason A, NP   25 mg at 04/22/17 2222  . loperamide (IMODIUM) capsule 2-4 mg  2-4 mg Oral PRN Nira ConnBerry, Jason A, NP      . LORazepam (ATIVAN) tablet 1 mg  1 mg Oral Q6H PRN Nira ConnBerry, Jason A, NP   1 mg at 04/22/17 0148  . [  START ON 04/24/2017] LORazepam (ATIVAN) tablet 1 mg  1 mg Oral BID [name], NP       Followed by  . LORazepam (ATIVAN) tablet 1 mg  1 mg Oral TID [name], NP   1 mg at 04/23/17 1306   Followed by  . [START ON 04/25/2017] LORazepam (ATIVAN) tablet 1 mg  1 mg Oral Daily [name], NP      . magnesium hydroxide (MILK OF MAGNESIA) suspension 30 mL  30 mL Oral Daily PRN [name], NP      . multivitamin with minerals tablet 1 tablet  1 tablet Oral Daily [name], NP   1 tablet at 04/23/17 [phone]  . nicotine (NICODERM CQ - dosed in mg/24 hours) patch 21 mg  21 mg Transdermal Daily [name], [name], MD   21 mg at 04/23/17 0918  .  ondansetron (ZOFRAN-ODT) disintegrating tablet 4 mg  4 mg Oral Q6H PRN [name], NP      . sertraline (ZOLOFT) tablet 50 mg  50 mg Oral Daily [name], [name], MD   50 mg at 04/23/17 0919  . thiamine (VITAMIN B-1) tablet 100 mg  100 mg Oral Daily [name], NP   100 mg at 04/23/17 5621  . traZODone (DESYREL) tablet 50 mg  50 mg Oral QHS PRN [name], NP   50 mg at 04/22/17 2222    Lab Results:  Results for orders placed or performed during the hospital encounter of 04/22/17 (from the past 48 hour(s))  Hemoglobin A1c     Status: Abnormal   Collection Time: 04/22/17  6:26 AM  Result Value Ref Range   Hgb A1c MFr Bld 5.9 (H) 4.8 - 5.6 %    Comment: (NOTE)         Pre-diabetes: 5.7 - 6.4         Diabetes: >6.4         Glycemic control for adults with diabetes: <7.0    Mean Plasma Glucose 123 mg/dL    Comment: (NOTE) Performed At: [facility] [address] [place], [place] [id number] Mila Homer MD [id number] Performed at [facility], [address] [place], [place] [address]   Lipid panel     Status: Abnormal   Collection Time: 04/22/17  6:26 AM  Result Value Ref Range   Cholesterol 169 0 - 200 mg/dL   Triglycerides 725 (H) <150 mg/dL   HDL 39 (L) >36 mg/dL   Total CHOL/HDL Ratio 4.3 RATIO   VLDL 34 0 - 40 mg/dL   LDL Cholesterol 96 0 - 99 mg/dL    Comment:        Total Cholesterol/HDL:CHD Risk Coronary Heart Disease Risk Table                     Men   Women  1/2 Average Risk   3.4   3.3  Average Risk       5.0   4.4  2 X Average Risk   9.6   7.1  3 X Average Risk  23.4   11.0        Use the calculated Patient Ratio above and the CHD Risk Table to determine the patient's CHD Risk.        ATP III CLASSIFICATION (LDL):  <100     mg/dL   Optimal  644-034  mg/dL   Near or Above  Optimal  130-159  mg/dL   Borderline  161-096  mg/dL   High  >045     mg/dL   Very High Performed at [facility], [address], [place], [place] [address]   TSH     Status: Abnormal   Collection Time: 04/22/17  6:26 AM  Result Value Ref Range   TSH 8.997 (H) 0.350 - 4.500 uIU/mL    Comment: Performed by a 3rd Generation assay with a functional sensitivity of <=0.01 uIU/mL. Performed at [facility], [address], [place], [place] [address]   T4, free     Status: None   Collection Time: 04/23/17  7:02 AM  Result Value Ref Range   Free T4 1.11 0.61 - 1.12 ng/dL    Comment: (NOTE) Biotin ingestion may interfere with free T4 tests. If the results are inconsistent with the TSH level, previous test results, or the clinical presentation, then consider biotin interference. If needed, order repeat testing after stopping biotin. Performed at [facility], [address], [place], [place] [address]     Blood Alcohol level:  No results found for: [facility]  Metabolic Disorder Labs: Lab Results  Component Value Date   HGBA1C 5.9 (H) 04/22/2017   MPG 123 04/22/2017   No results found for: PROLACTIN Lab Results  Component Value Date   CHOL 169 04/22/2017   TRIG 171 (H) 04/22/2017   HDL 39 (L) 04/22/2017   CHOLHDL 4.3 04/22/2017   VLDL 34 04/22/2017   LDLCALC 96 04/22/2017    Physical Findings: AIMS: Facial and Oral Movements Muscles of Facial Expression: None, normal Lips and Perioral Area: None, normal Jaw: None, normal Tongue: None, normal,Extremity Movements Upper (arms, wrists, hands, fingers): None, normal Lower (legs, knees, ankles, toes): None, normal, Trunk Movements Neck, shoulders, hips: None, normal, Overall Severity Severity of abnormal movements (highest score from questions above): None, normal Incapacitation due to abnormal movements: None, normal Patient's awareness of abnormal movements (rate only patient's report): No Awareness, Dental Status Current problems with teeth and/or dentures?: No Does patient usually wear dentures?: No  CIWA:   CIWA-Ar Total: 4 COWS:     Musculoskeletal: Strength & Muscle Tone: within normal limits Gait & Station: normal Patient leans: N/A  Psychiatric Specialty Exam: Physical Exam  Nursing note and vitals reviewed. Constitutional: He is oriented to person, place, and time.  Neurological: He is alert and oriented to person, place, and time.  Psychiatric: He has a normal mood and affect. His behavior is normal.    Review of Systems  Psychiatric/Behavioral: Positive for depression, hallucinations and substance abuse.    Blood pressure 126/75, pulse 94, temperature 98.1 F (36.7 C), resp. rate 20, height 6' (1.829 m), weight 80.3 kg (177 lb).Body mass index is 24.01 kg/m.  General Appearance: Disheveled  Eye Contact:  Fair  Speech:  Clear and Coherent  Volume:  Normal  Mood:  Anxious and Depressed  Affect:  Blunt and Depressed  Thought Process:  Coherent  Orientation:  Full (Time, Place, and Person)  Thought Content:  Hallucinations: Auditory  Suicidal Thoughts:  No  Homicidal Thoughts:  No  Memory:  Immediate;   Fair Recent;   Fair Remote;   Fair  Judgement:  Fair  Insight:  Fair  Psychomotor Activity:  Normal  Concentration:  Concentration: Fair  Recall:  [employer] of Knowledge:  Fair  Language:  Fair  Akathisia:  No  Handed:  Right  AIMS (if indicated):  Assets:  Communication Skills Desire for Improvement Resilience Social Support  ADL's:  Intact  Cognition:  WNL  Sleep:  Number of Hours: 5.75     I agree with current treatment plan on 04/23/2017, Patient seen face-to-face for psychiatric evaluation follow-up and chart was reviewed. The information documented and agree with the treatment plan.  Treatment Plan Summary: Daily contact with patient to assess and evaluate symptoms and progress in treatment and Medication management   - consider 500 hall bed when available Increase  Abilify 5 mg to 10 mg , Zoloft 50mg  for mood stabilization. Continue with  Trazodone 50 mg for insomnia Started on CWIA/ AtivanProtocol Will continue to monitor vitals ,medication compliance and treatment side effects while patient is here.  Reviewed labs:,BAL - , UDS - . CSW will start working on disposition.  Patient to participate in therapeutic milieu   [name], NP 04/23/2017, 1:42 PM

## 2017-04-23 NOTE — Progress Notes (Addendum)
Data. Patient denies HI. Continues to report delusions about, "The devil" and "The devil's desciples." Patient affect is fearful and very watchful when he comes out of his room for medications or labs. Patient is still refusing to come out of his room and go to the cafeteria for meal at lunch. Did eat a small salad brought to his room. Went down to dinner. Fluids encouraged. Voices are still telling him about, "The devil and that he is going to find me and possess me." NP aware and new orders received.On his self assessment he reports 5/10 for depression, 7/10 for hopelessness and 5/10 for anxiety. His goal for today is, "Just trying to focus on getting the voices to go away."  Action. Emotional support and encouragement offered. Education provided on medication, indications and side effect. Q 15 minute checks done for safety. Response. Safety on the unit maintained through 15 minute checks.  Medications taken as prescribed.

## 2017-04-24 DIAGNOSIS — F17210 Nicotine dependence, cigarettes, uncomplicated: Secondary | ICD-10-CM

## 2017-04-24 LAB — T3, FREE: T3, Free: 3.4 pg/mL (ref 2.0–4.4)

## 2017-04-24 MED ORDER — BENZTROPINE MESYLATE 1 MG PO TABS
1.0000 mg | ORAL_TABLET | Freq: Four times a day (QID) | ORAL | Status: DC | PRN
  Administered 2017-04-24 – 2017-04-25 (×2): 1 mg via ORAL
  Filled 2017-04-24 (×2): qty 1

## 2017-04-24 MED ORDER — TRAZODONE HCL 100 MG PO TABS
100.0000 mg | ORAL_TABLET | Freq: Every evening | ORAL | Status: DC | PRN
Start: 2017-04-24 — End: 2017-04-28
  Administered 2017-04-24 – 2017-04-27 (×2): 100 mg via ORAL
  Filled 2017-04-24: qty 1
  Filled 2017-04-24: qty 7
  Filled 2017-04-24: qty 1

## 2017-04-24 MED ORDER — HALOPERIDOL 5 MG PO TABS
5.0000 mg | ORAL_TABLET | Freq: Four times a day (QID) | ORAL | Status: DC | PRN
  Administered 2017-04-24 – 2017-04-25 (×2): 5 mg via ORAL
  Filled 2017-04-24 (×2): qty 1

## 2017-04-24 NOTE — [facility] Group Notes (Signed)
[facility] LCSW Group Therapy  04/24/2017 11:39 AM  Type of Therapy:  Group Therapy  Participation Level:  Did Not Attend-pt invited. Chose to remain in bed.    Modes of Intervention:  Confrontation, Discussion, Education, Problem-solving, Socialization and Support  Summary of Progress/Problems: Today's Topic: Overcoming Obstacles. Patients identified one short term goal and potential obstacles in reaching this goal. Patients processed barriers involved in overcoming these obstacles. Patients identified steps necessary for overcoming these obstacles and explored motivation (internal and external) for facing these difficulties head on.   Desma Wilkowski N Smart LCSW 04/24/2017, 11:39 AM

## 2017-04-24 NOTE — Progress Notes (Signed)
[facility]  04/24/2017 2:38 PM Carl Mcdaniel  MRN:  [id number]   Subjective:  Patient in room.  Withdrawn, responding in monosyllables.  Objective: Carl Mcdaniel, 36 yo was admitted with worsening depression.  He developed SI.  He stated multiple stressors.  Per records, [facility]  He is non disruptive.  Compliant on meds.  However, isolative in room.  He denies SI and HI.  Remains guarded.  Principal Problem: Severe episode of recurrent major depressive disorder, with psychotic features (HCC) Diagnosis:   Patient Active Problem List   Diagnosis Date Noted  . Severe major depression without psychotic features (HCC) [F32.2] 04/22/2017  . Severe episode of recurrent major depressive disorder, with psychotic features (HCC) [F33.3]    Total Time spent with patient: 30 minutes  Past Psychiatric History:   Past Medical History: History reviewed. No pertinent past medical history. History reviewed. No pertinent surgical history. Family History: History reviewed. No pertinent family history. Family Psychiatric  History:  Social History:  History  Alcohol Use  . Yes     History  Drug Use  . Types: Amphetamines    Social History   Social History  . Marital status: Single    Spouse name: N/A  . Number of children: N/A  . Years of education: N/A   Social History Main Topics  . Smoking status: Current Every Day Smoker    Packs/day: 1.50    Types: Cigarettes  . Smokeless tobacco: Never Used  . Alcohol use Yes  . Drug use: Yes    Types: Amphetamines  . Sexual activity: Not Asked   Other Topics Concern  . None   Social History Narrative  . None   Additional Social History:    Pain Medications: SEE MAR Prescriptions: SEE MAR Over the Counter: SEE MAR History of alcohol / drug use?: Yes Longest period of sobriety (when/how long): unknown Negative Consequences of Use: Financial, Legal, Personal relationships Withdrawal  Symptoms: Patient aware of relationship between substance abuse and physical/medical complications Name of Substance 1: alcohol 1 - Age of First Use: unspecified 1 - Amount (size/oz): 12 pack beer or more 1 - Frequency: daily 1 - Duration: years 1 - Last Use / Amount: 04/21/17 4 beers or more                  Sleep: Fair  Appetite:  Fair  Current Medications: Current Facility-Administered Medications  Medication Dose Route Frequency Provider Last Rate Last Dose  . acetaminophen (TYLENOL) tablet 650 mg  650 mg Oral Q6H PRN Nira ConnBerry, Jason A, NP   650 mg at 04/23/17 0921  . alum & mag hydroxide-simeth (MAALOX/MYLANTA) 200-200-20 MG/5ML suspension 30 mL  30 mL Oral Q4H PRN Nira ConnBerry, Jason A, NP      . ARIPiprazole (ABILIFY) tablet 10 mg  10 mg Oral Daily Oneta RackLewis, Tanika N, NP   10 mg at 04/24/17 0759  . hydrOXYzine (ATARAX/VISTARIL) tablet 25 mg  25 mg Oral Q6H PRN Nira ConnBerry, Jason A, NP   25 mg at 04/22/17 2222  . loperamide (IMODIUM) capsule 2-4 mg  2-4 mg Oral PRN Nira ConnBerry, Jason A, NP      . LORazepam (ATIVAN) tablet 1 mg  1 mg Oral Q6H PRN Nira ConnBerry, Jason A, NP   1 mg at 04/23/17 2157  . LORazepam (ATIVAN) tablet 1 mg  1 mg Oral BID Nira ConnBerry, Jason A, NP   1 mg at 04/24/17 0801   Followed  by  . Melene Muller ON 04/25/2017] LORazepam (ATIVAN) tablet 1 mg  1 mg Oral Daily [name], NP      . magnesium hydroxide (MILK OF MAGNESIA) suspension 30 mL  30 mL Oral Daily PRN [name], NP      . multivitamin with minerals tablet 1 tablet  1 tablet Oral Daily [name], NP   1 tablet at 04/24/17 0759  . nicotine (NICODERM CQ - dosed in mg/24 hours) patch 21 mg  21 mg Transdermal Daily [name], [name], MD   21 mg at 04/24/17 0800  . ondansetron (ZOFRAN-ODT) disintegrating tablet 4 mg  4 mg Oral Q6H PRN [name], NP      . sertraline (ZOLOFT) tablet 50 mg  50 mg Oral Daily [name], [name], MD   50 mg at 04/24/17 0759  . thiamine (VITAMIN B-1) tablet 100 mg  100 mg Oral Daily [name], NP    100 mg at 04/24/17 0759  . traZODone (DESYREL) tablet 50 mg  50 mg Oral QHS PRN [name], NP   50 mg at 04/23/17 2157    Lab Results:  Results for orders placed or performed during the hospital encounter of 04/22/17 (from the past 48 hour(s))  T3, free     Status: None   Collection Time: 04/23/17  7:02 AM  Result Value Ref Range   T3, Free 3.4 2.0 - 4.4 pg/mL    Comment: (NOTE) Performed At: [facility] [address] [place], [place] [id number] Mila Homer MD [id number] Performed at [facility], [address], [place], [place] [address]   T4, free     Status: None   Collection Time: 04/23/17  7:02 AM  Result Value Ref Range   Free T4 1.11 0.61 - 1.12 ng/dL    Comment: (NOTE) Biotin ingestion may interfere with free T4 tests. If the results are inconsistent with the TSH level, previous test results, or the clinical presentation, then consider biotin interference. If needed, order repeat testing after stopping biotin. Performed at [facility], [address], [place], [place] [address]     Blood Alcohol level:  No results found for: [facility]  Metabolic Disorder Labs: Lab Results  Component Value Date   HGBA1C 5.9 (H) 04/22/2017   MPG 123 04/22/2017   No results found for: PROLACTIN Lab Results  Component Value Date   CHOL 169 04/22/2017   TRIG 171 (H) 04/22/2017   HDL 39 (L) 04/22/2017   CHOLHDL 4.3 04/22/2017   VLDL 34 04/22/2017   LDLCALC 96 04/22/2017    Physical Findings: AIMS: Facial and Oral Movements Muscles of Facial Expression: None, normal Lips and Perioral Area: None, normal Jaw: None, normal Tongue: None, normal,Extremity Movements Upper (arms, wrists, hands, fingers): None, normal Lower (legs, knees, ankles, toes): None, normal, Trunk Movements Neck, shoulders, hips: None, normal, Overall Severity Severity of abnormal movements (highest score from questions above): None, normal Incapacitation due  to abnormal movements: None, normal Patient's awareness of abnormal movements (rate only patient's report): No Awareness, Dental Status Current problems with teeth and/or dentures?: No Does patient usually wear dentures?: No  CIWA:  CIWA-Ar Total: 0 COWS:     Musculoskeletal: Strength & Muscle Tone: within normal limits Gait & Station: normal Patient leans: N/A  Psychiatric Specialty Exam: Physical Exam  Nursing note and vitals reviewed. Constitutional: He is oriented to person, place, and time.  Neurological: He is alert and oriented to  person, place, and time.  Psychiatric: He has a normal mood and affect. His behavior is normal.    Review of Systems  Psychiatric/Behavioral: Positive for depression, hallucinations and substance abuse.    Blood pressure 129/84, pulse 90, temperature 98.4 F (36.9 C), temperature source Oral, resp. rate 16, height 6' (1.829 m), weight 80.3 kg (177 lb).Body mass index is 24.01 kg/m.  General Appearance: Disheveled  Eye Contact:  Fair  Speech:  Clear and Coherent  Volume:  Normal  Mood:  Anxious and Depressed  Affect:  Blunt and Depressed  Thought Process:  Coherent  Orientation:  Full (Time, Place, and Person)  Thought Content:  Hallucinations: Auditory  Suicidal Thoughts:  No  Homicidal Thoughts:  No  Memory:  Immediate;   Fair Recent;   Fair Remote;   Fair  Judgement:  Fair  Insight:  Fair  Psychomotor Activity:  Normal  Concentration:  Concentration: Fair  Recall:  [employer] of Knowledge:  Fair  Language:  Fair  Akathisia:  No  Handed:  Right  AIMS (if indicated):     Assets:  Communication Skills Desire for Improvement Resilience Social Support  ADL's:  Intact  Cognition:  WNL  Sleep:  Number of Hours: 6   Treatment Plan Summary:  Reviewed plan of care 04/24/2017  Daily contact with patient to assess and evaluate symptoms and progress in treatment and Medication management   - consider 500 hall bed when available Cont   Abilify 10 mg , Zoloft 50mg  for mood stabilization. Continue with Trazodone 50 mg for insomnia Started on CWIA/ AtivanProtocol Will continue to monitor vitals ,medication compliance and treatment side effects while patient is here.  Reviewed labs:,BAL - , UDS - . CSW will start working on disposition.  Patient to participate in therapeutic milieu  Lindwood Qua, NP [facility] 04/24/2017, 2:38 PM

## 2017-04-24 NOTE — Progress Notes (Signed)
Adult Psychoeducational Group Note  Date:  04/24/2017 Time:  12:08 AM  Group Topic/Focus:  Wrap-Up Group:   The focus of this group is to help patients review their daily goal of treatment and discuss progress on daily workbooks.  Participation Level:  Did Not Attend  Participation Quality:  Patient did not attend  Affect:  Patient did not attend  Cognitive:  Patient did not attend  Insight: None  Engagement in Group:  Patient did not attend  Modes of Intervention:  Patient did not attend  Additional Comments:  Patient did not attend wrap up group. Patient attended the AA group this evening.  Felipa FurnaceChristopher  Dionisios Ricci 04/24/2017, 12:08 AM

## 2017-04-24 NOTE — Tx Team (Signed)
Interdisciplinary Treatment and Diagnostic Plan Update  04/24/2017 Time of Session: 0930 Carl Mcdaniel MRN: [id number]  Principal Diagnosis: Severe episode of recurrent major depressive disorder, with psychotic features (HCC)  Secondary Diagnoses: Principal Problem:   Severe episode of recurrent major depressive disorder, with psychotic features (HCC) Active Problems:   Severe major depression without psychotic features (HCC)   Current Medications:  Current Facility-Administered Medications  Medication Dose Route Frequency Provider Last Rate Last Dose  . acetaminophen (TYLENOL) tablet 650 mg  650 mg Oral Q6H PRN [name], NP   650 mg at 04/23/17 0921  . alum & mag hydroxide-simeth (MAALOX/MYLANTA) 200-200-20 MG/5ML suspension 30 mL  30 mL Oral Q4H PRN [name], NP      . ARIPiprazole (ABILIFY) tablet 10 mg  10 mg Oral Daily [name], NP   10 mg at 04/24/17 0759  . hydrOXYzine (ATARAX/VISTARIL) tablet 25 mg  25 mg Oral Q6H PRN [name], NP   25 mg at 04/22/17 2222  . loperamide (IMODIUM) capsule 2-4 mg  2-4 mg Oral PRN [name], NP      . LORazepam (ATIVAN) tablet 1 mg  1 mg Oral Q6H PRN [name], NP   1 mg at 04/23/17 2157  . LORazepam (ATIVAN) tablet 1 mg  1 mg Oral BID [name], NP   1 mg at 04/24/17 0801   Followed by  . [START ON 04/25/2017] LORazepam (ATIVAN) tablet 1 mg  1 mg Oral Daily [name], NP      . magnesium hydroxide (MILK OF MAGNESIA) suspension 30 mL  30 mL Oral Daily PRN [name], NP      . multivitamin with minerals tablet 1 tablet  1 tablet Oral Daily [name], NP   1 tablet at 04/24/17 0759  . nicotine (NICODERM CQ - dosed in mg/24 hours) patch 21 mg  21 mg Transdermal Daily [name], [name], MD   21 mg at 04/24/17 0800  . ondansetron (ZOFRAN-ODT) disintegrating tablet 4 mg  4 mg Oral Q6H PRN [name], NP      . sertraline (ZOLOFT) tablet 50 mg  50 mg Oral Daily [name], [name], MD   50 mg at  04/24/17 0759  . thiamine (VITAMIN B-1) tablet 100 mg  100 mg Oral Daily [name], NP   100 mg at 04/24/17 0759  . traZODone (DESYREL) tablet 50 mg  50 mg Oral QHS PRN [name], NP   50 mg at 04/23/17 2157   PTA Medications: Prescriptions Prior to Admission  Medication Sig Dispense Refill Last Dose  . Aspirin-Acetaminophen (GOODYS BODY PAIN PO) Take 1 tablet by mouth every 6 (six) hours as needed (back pain).   Past Month at Unknown time    Patient Stressors: Financial difficulties Medication change or noncompliance Occupational concerns Substance abuse  Patient Strengths: Capable of independent living General fund of knowledge  Treatment Modalities: Medication Management, Group therapy, Case management,  1 to 1 session with clinician, Psychoeducation, Recreational therapy.   Physician Treatment Plan for Primary Diagnosis: Severe episode of recurrent major depressive disorder, with psychotic features (HCC) Long Term Goal(s): Improvement in symptoms so as ready for discharge Improvement in symptoms so as ready for discharge   Short Term Goals: Ability to identify changes in lifestyle to reduce recurrence of condition will improve Ability to verbalize feelings will improve Ability to disclose and discuss suicidal ideas Ability to identify and  develop effective coping behaviors will improve Ability to identify and develop effective coping behaviors will improve Ability to maintain clinical measurements within normal limits will improve Ability to identify triggers associated with substance abuse/mental health issues will improve  Medication Management: Evaluate patient's response, side effects, and tolerance of medication regimen.  Therapeutic Interventions: 1 to 1 sessions, Unit Group sessions and Medication administration.  Evaluation of Outcomes: Progressing  Physician Treatment Plan for Secondary Diagnosis: Principal Problem:   Severe episode of recurrent major  depressive disorder, with psychotic features (HCC) Active Problems:   Severe major depression without psychotic features (HCC)  Long Term Goal(s): Improvement in symptoms so as ready for discharge Improvement in symptoms so as ready for discharge   Short Term Goals: Ability to identify changes in lifestyle to reduce recurrence of condition will improve Ability to verbalize feelings will improve Ability to disclose and discuss suicidal ideas Ability to identify and develop effective coping behaviors will improve Ability to identify and develop effective coping behaviors will improve Ability to maintain clinical measurements within normal limits will improve Ability to identify triggers associated with substance abuse/mental health issues will improve     Medication Management: Evaluate patient's response, side effects, and tolerance of medication regimen.  Therapeutic Interventions: 1 to 1 sessions, Unit Group sessions and Medication administration.  Evaluation of Outcomes: Progressing   RN Treatment Plan for Primary Diagnosis: Severe episode of recurrent major depressive disorder, with psychotic features (HCC) Long Term Goal(s): Knowledge of disease and therapeutic regimen to maintain health will improve  Short Term Goals: Ability to remain free from injury will improve, Ability to disclose and discuss suicidal ideas and Ability to identify and develop effective coping behaviors will improve  Medication Management: RN will administer medications as ordered by provider, will assess and evaluate patient's response and provide education to patient for prescribed medication. RN will report any adverse and/or side effects to prescribing provider.  Therapeutic Interventions: 1 on 1 counseling sessions, Psychoeducation, Medication administration, Evaluate responses to treatment, Monitor vital signs and CBGs as ordered, Perform/monitor CIWA, COWS, AIMS and Fall Risk screenings as ordered,  Perform wound care treatments as ordered.  Evaluation of Outcomes: Progressing   LCSW Treatment Plan for Primary Diagnosis: Severe episode of recurrent major depressive disorder, with psychotic features (HCC) Long Term Goal(s): Safe transition to appropriate next level of care at discharge, Engage patient in therapeutic group addressing interpersonal concerns.  Short Term Goals: Engage patient in aftercare planning with referrals and resources, Facilitate patient progression through stages of change regarding substance use diagnoses and concerns and Identify triggers associated with mental health/substance abuse issues  Therapeutic Interventions: Assess for all discharge needs, 1 to 1 time with Social worker, Explore available resources and support systems, Assess for adequacy in community support network, Educate family and significant other(s) on suicide prevention, Complete Psychosocial Assessment, Interpersonal group therapy.  Evaluation of Outcomes: Progressing   Progress in Treatment: Attending groups: Yes. Participating in groups: Yes. Taking medication as prescribed: Yes. Toleration medication: Yes. Family/Significant other contact made: No, will contact:  family member/friend if patient consents Patient understands diagnosis: Yes. Discussing patient identified problems/goals with staff: Yes. Medical problems stabilized or resolved: Yes. Denies suicidal/homicidal ideation: Yes. Issues/concerns per patient self-inventory: No. Other: n/a  New problem(s) identified: No, Describe:  n/a  New Short Term/Long Term Goal(s): elimination of SI thoughts; medication stabilization; detox; development of comprehensive mental wellness/sobriety plan.   Discharge Plan or Barriers: CSW assessing for appropriate referrals. Pt given inpatient and outpatient potential  options and is currently thinking about what he wants to do from here. Pt currently lives with various friends for a few days at a  time and identifies as homeless.   Reason for Continuation of Hospitalization: Depression Medication stabilization Suicidal ideation Withdrawal symptoms  Estimated Length of Stay: 3-5 days   Attendees: Patient: 04/24/2017 10:20 AM  Physician: Dr. Jama Flavors MD 04/24/2017 10:20 AM  Nursing: Lorelle Gibbs RN 04/24/2017 10:20 AM  RN Care Manager: Onnie Boer CM 04/24/2017 10:20 AM  Social Worker: Herbert Seta Smart, LCSW; Vernie Shanks LCSW 04/24/2017 10:20 AM  Recreational Therapist: x 04/24/2017 10:20 AM  Other: Armandina Stammer NP; May Augustin NP 04/24/2017 10:20 AM  Other:  04/24/2017 10:20 AM  Other: 04/24/2017 10:20 AM    Scribe for Treatment Team: Ledell Peoples Smart, LCSW 04/24/2017 10:20 AM

## 2017-04-24 NOTE — Progress Notes (Signed)
Recreation Therapy Notes  Date: 04/24/17 Time: 0930 Location: [address] Dayroom  Group Topic: Stress Management  Goal Area(s) Addresses:  Patient will verbalize importance of using healthy stress management.  Patient will identify positive emotions associated with healthy stress management.   Intervention: Stress Management  Activity :  Letting Go.  LRT introduced the stress management technique of meditation.  LRT played a meditation on the importance of letting go of the past and things that hold us back.  Patients were to follow along as the meditation played to participate in the activity.    Education:  Stress Management, Discharge Planning.   Education Outcome: Acknowledges edcuation/In group clarification offered/Needs additional education  Clinical Observations/Feedback: Pt did not attend group.    Caroll RancherMarjette Calisa Luckenbaugh, LRT/CTRS         Caroll RancherLindsay, Tyshay Adee A 04/24/2017 11:52 AM

## 2017-04-24 NOTE — Plan of Care (Signed)
Problem: Education: Goal: Utilization of techniques to improve thought processes will improve Outcome: Progressing Nurse discussed depression/coping skills with patient.    

## 2017-04-24 NOTE — Progress Notes (Signed)
D:  Patient stated he does have SI thoughts, contracts for safety.  Denied HI.  Voices to hurt himself and see things.  Patient would not discuss his feelings.  "I try to stay to myself."  Patient withdraws and isolates in his room. A:  Medications administered per MD orders.  Emotional support and encouragement given patient. R:  Safety maintained with 15 minute checks. Patient has stayed in bed most of the day.  Did go to the dining room for lunch and dinner today.

## 2017-04-24 NOTE — Progress Notes (Signed)
Adult Psychoeducational Group Note  Date:  04/24/2017 Time:  10:56 PM  Group Topic/Focus:  Wrap-Up Group:   The focus of this group is to help patients review their daily goal of treatment and discuss progress on daily workbooks.  Participation Level:  Did not attend  Additional Comments:  Pt was asleep in bed and did not get up for group.  Caswell CorwinOwen, Avinash Maltos C 04/24/2017, 10:56 PM

## 2017-04-24 NOTE — [facility] Suicide Risk Assessment (Signed)
[facility] INPATIENT:  Family/Significant Other Suicide Prevention Education  Suicide Prevention Education:  Patient Refusal for Family/Significant Other Suicide Prevention Education: The patient Carl Mcdaniel CourierMesser has refused to provide written consent for family/significant other to be provided Family/Significant Other Suicide Prevention Education during admission and/or prior to discharge.  Physician notified.  SPE completed with pt, as pt refused to consent to family contact. SPI pamphlet provided to pt and pt was encouraged to share information with support network, ask questions, and talk about any concerns relating to SPE. Pt denies access to guns/firearms and verbalized understanding of information provided. Mobile Crisis information also provided to pt.   Caileen Veracruz N Smart LCSW 04/24/2017, 10:57 AM

## 2017-04-25 DIAGNOSIS — F1994 Other psychoactive substance use, unspecified with psychoactive substance-induced mood disorder: Secondary | ICD-10-CM

## 2017-04-25 DIAGNOSIS — R45851 Suicidal ideations: Secondary | ICD-10-CM

## 2017-04-25 MED ORDER — ARIPIPRAZOLE 10 MG PO TABS
20.0000 mg | ORAL_TABLET | Freq: Every day | ORAL | Status: DC
  Administered 2017-04-26 – 2017-04-28 (×3): 20 mg via ORAL
  Filled 2017-04-25: qty 2
  Filled 2017-04-25: qty 14
  Filled 2017-04-25 (×4): qty 2

## 2017-04-25 MED ORDER — ARIPIPRAZOLE 5 MG PO TABS
5.0000 mg | ORAL_TABLET | Freq: Once | ORAL | Status: AC
  Administered 2017-04-25: 5 mg via ORAL
  Filled 2017-04-25 (×2): qty 1

## 2017-04-25 NOTE — Progress Notes (Signed)
Pt has been in bed all evening.  He reports that he is still having passive suicidal thoughts.  He denies HI, but states he is still hearing voices telling him to hurt himself and also seeing shadows in the room that startle him.  He reports feeling paranoid, especially around a lot of people.  He contracts for [employer] with writer. He reports no other needs or concerns.  He denies having any withdrawal symptoms.  Writer discussed the pt's concerns of hearing voices with the provider and received orders of Haldol  5 mg and Cogentin 1 mg prn which were given.  Pt also received his sleep aid of Trazodone 100 mg.  Support and encouragement offered.  Pt was encouraged to make his needs known to staff.  Discharge plans are in process.  Safety maintained with q15 minute checks.

## 2017-04-25 NOTE — Progress Notes (Signed)
Pt did not attend the evening AA speaker meeting. Pt remained in bed during group. Goldman Birchall C, NT  04/25/17 9:41 PM 

## 2017-04-25 NOTE — Progress Notes (Signed)
[facility] MD Progress Note  04/25/2017 6:17 PM Carl Mcdaniel  MRN:  [id number] Subjective:   36 yo AAM with history of SUD. Self presented for voluntary admission. Odd at presentation. Reports a feeling that something terrible is about to happen. Has been tormented by voices threatening to harm him. Has been feeling depressed since then. Feels he is being watched. Expressed suicidal thoughts without any definitive plans. Admits use of alcohol and THC. No UDS or BAL at admission.  Chart reviewed today. Patient discussed at team  Staff reports that he has been isolating himself. He reports seeing shadows. He is still hearing voices threatening him. He has not been able to participate at groups.  Seen today, in his room trying to work on puzzles. Says he is not able to focus. Distracted by voices telling him to hurt himself. No intent to act on them. Says PRN's has helped. No side effects from his medications. No passivity of will. No command to hurt others. Still feels down. We discussed increasing his antipsychotic agent today.   Principal Problem: Substance Induced Psychotic Disorder Diagnosis:   Patient Active Problem List   Diagnosis Date Noted  . Severe major depression without psychotic features (HCC) [F32.2] 04/22/2017  . Severe episode of recurrent major depressive disorder, with psychotic features (HCC) [F33.3]    Total Time spent with patient: 20 minutes  Past Psychiatric History: As in H&P  Past Medical History: History reviewed. No pertinent past medical history. History reviewed. No pertinent surgical history. Family History: History reviewed. No pertinent family history. Family Psychiatric  History: As in H&P Social History:  History  Alcohol Use  . Yes     History  Drug Use  . Types: Amphetamines    Social History   Social History  . Marital status: Single    Spouse name: N/A  . Number of children: N/A  . Years of education: N/A   Social History Main Topics  .  Smoking status: Current Every Day Smoker    Packs/day: 1.50    Types: Cigarettes  . Smokeless tobacco: Never Used  . Alcohol use Yes  . Drug use: Yes    Types: Amphetamines  . Sexual activity: Not Asked   Other Topics Concern  . None   Social History Narrative  . None   Additional Social History:    Pain Medications: SEE MAR Prescriptions: SEE MAR Over the Counter: SEE MAR History of alcohol / drug use?: Yes Longest period of sobriety (when/how long): unknown Negative Consequences of Use: Financial, Legal, Personal relationships Withdrawal Symptoms: Patient aware of relationship between substance abuse and physical/medical complications Name of Substance 1: alcohol 1 - Age of First Use: unspecified 1 - Amount (size/oz): 12 pack beer or more 1 - Frequency: daily 1 - Duration: years 1 - Last Use / Amount: 04/21/17 4 beers or more     Sleep: Fair  Appetite:  Fair  Current Medications: Current Facility-Administered Medications  Medication Dose Route Frequency Provider Last Rate Last Dose  . acetaminophen (TYLENOL) tablet 650 mg  650 mg Oral Q6H PRN [name], NP   650 mg at 04/23/17 0921  . alum & mag hydroxide-simeth (MAALOX/MYLANTA) 200-200-20 MG/5ML suspension 30 mL  30 mL Oral Q4H PRN [name], NP      . ARIPiprazole (ABILIFY) tablet 10 mg  10 mg Oral Daily [name], NP   10 mg at 04/25/17 7829  . haloperidol (HALDOL) tablet 5 mg  5 mg Oral  Q6H PRN Kerry HoughSimon, Spencer E, PA-C   5 mg at 04/24/17 2046   And  . benztropine (COGENTIN) tablet 1 mg  1 mg Oral Q6H PRN Kerry HoughSimon, Spencer E, PA-C   1 mg at 04/24/17 2046  . magnesium hydroxide (MILK OF MAGNESIA) suspension 30 mL  30 mL Oral Daily PRN Nira ConnBerry, Jason A, NP      . multivitamin with minerals tablet 1 tablet  1 tablet Oral Daily Nira ConnBerry, Jason A, NP   1 tablet at 04/25/17 [phone]  . nicotine (NICODERM CQ - dosed in mg/24 hours) patch 21 mg  21 mg Transdermal Daily [name], Rockey SituFernando A, MD   21 mg at 04/25/17 0811  .  sertraline (ZOLOFT) tablet 50 mg  50 mg Oral Daily [name], Rockey SituFernando A, MD   50 mg at 04/25/17 [id number]  . thiamine (VITAMIN B-1) tablet 100 mg  100 mg Oral Daily Nira ConnBerry, Jason A, NP   100 mg at 04/25/17 [id number]  . traZODone (DESYREL) tablet 100 mg  100 mg Oral QHS PRN Kerry HoughSimon, Spencer E, PA-C   100 mg at 04/24/17 2046    Lab Results: No results found for this or any previous visit (from the past 48 hour(s)).  Blood Alcohol level:  [facility]: [facility]  Metabolic Disorder Labs: Lab Results  Component Value Date   HGBA1C 5.9 (H) 04/22/2017   MPG 123 04/22/2017   No results found for: PROLACTIN Lab Results  Component Value Date   CHOL 169 04/22/2017   TRIG 171 (H) 04/22/2017   HDL 39 (L) 04/22/2017   CHOLHDL 4.3 04/22/2017   VLDL 34 04/22/2017   LDLCALC 96 04/22/2017    Physical Findings: AIMS: Facial and Oral Movements Muscles of Facial Expression: None, normal Lips and Perioral Area: None, normal Jaw: None, normal Tongue: None, normal,Extremity Movements Upper (arms, wrists, hands, fingers): None, normal Lower (legs, knees, ankles, toes): None, normal, Trunk Movements Neck, shoulders, hips: None, normal, Overall Severity Severity of abnormal movements (highest score from questions above): None, normal Incapacitation due to abnormal movements: None, normal Patient's awareness of abnormal movements (rate only patient's report): No Awareness, Dental Status Current problems with teeth and/or dentures?: No Does patient usually wear dentures?: No  CIWA:  CIWA-Ar Total: 1 COWS:  COWS Total Score: 1  Musculoskeletal: Strength & Muscle Tone: within normal limits Gait & Station: normal Patient leans: N/A  Psychiatric Specialty Exam: Physical Exam  Constitutional: He is oriented to person, place, and time. He appears well-developed and well-nourished.  HENT:  Head: Normocephalic and atraumatic.  Eyes: Conjunctivae are normal. Pupils are equal, round, and reactive to light.  Neck:  Normal range of motion. Neck supple.  Cardiovascular: Normal rate and regular rhythm.   Respiratory: Effort normal and breath sounds normal.  GI: Soft. Bowel sounds are normal.  Musculoskeletal: Normal range of motion.  Neurological: He is alert and oriented to person, place, and time.  Skin: Skin is warm and dry.  Psychiatric:  As above    ROS  Blood pressure 122/77, pulse 80, temperature 97.9 F (36.6 C), temperature source Oral, resp. rate 16, height 6' (1.829 m), weight 80.3 kg (177 lb).Body mass index is 24.01 kg/m.  General Appearance: Disheveled, psychomotor retardation. Has been in his room all day. Limited engagement  Eye Contact:  Poor  Speech:  Slow  Volume:  Decreased  Mood:  Overwhelmed by his subjective experience.   Affect:  Flat and has an odd quality.   Thought Process:  Linear  Orientation:  Full (Time, Place, and Person)  Thought Content: delusional atmosphere. Auditory hallucination. No preoccupation with violent thoughts.    Suicidal Thoughts:  Yes.  without intent/plan  Homicidal Thoughts:  No  Memory:  Impaired  Judgement:  Impaired  Insight:  Shallow  Psychomotor Activity:  Decreased  Concentration:  Impaired  Recall:  Poor  Fund of Knowledge:  Fair  Language:  Good  Akathisia:  No  Handed:    AIMS (if indicated):     Assets:  Desire for Improvement  ADL's:  Impaired  Cognition:  WNL  Sleep:  Number of Hours: 6.5     Treatment Plan Summary: Patient is still psychotic. He remains at risk of self harm. We have agreed to mitigate risk by adjusting his medications today  Psychiatric: Substance Induced Psychotic Disorder SUD  Medical:  Psychosocial:   PLAN: 1. Abilify 5 mg stat. Increase daily dose to 20 mg from tomorrow 2. Encourage unit groups and activities 3. Continue to monitor mood, behavior and interaction with peers 4. Encourage patient to groom self. 5. Continue suicide precautions.    Georgiann Cocker, MD 04/25/2017, 6:17  PM

## 2017-04-25 NOTE — [facility] Group Notes (Signed)
The focus of this group is to educate the patient on the purpose and policies of crisis stabilization and provide a format to answer questions about their admission.  The group details unit policies and expectations of patients while admitted.  Patient did not attend 0900 nurse education orientation group this morning.  Patient stayed in room.  

## 2017-04-25 NOTE — Progress Notes (Signed)
D:  Patient stated he does have SI thoughts off/on, contracts for safety.  Denied HI.  Stated he does see shadows and hears voices. A:  Medications administered per MD orders.  Emotional support and encouragement given patient. R:  Safety maintained with 15 minute checks. Patient has been in bed most of the day  Patient stated he does not like to be around people.  Voices are telling him to hurt himself.  Patient has been encouraged throughout the day to attend and participate groups.

## 2017-04-25 NOTE — Plan of Care (Signed)
Problem: Coping: Goal: Ability to cope will improve Outcome: Progressing Nurse encouraged patient to attend and participate in groups.  This is an excellent opportunity to gain coping skills and set goals.

## 2017-04-25 NOTE — [facility] Group Notes (Signed)
[facility] LCSW Group Therapy 04/25/2017 1:15 PM  Type of Therapy: Group Therapy- Feelings about Diagnosis  Participation Level: Pt invited. Did not attend.  Jonathon JordanLynn B Thresea Doble, MSW, LCSWA 04/25/2017 4:25 PM

## 2017-04-26 NOTE — [facility] Group Notes (Signed)
[facility] LCSW Group Therapy 04/26/2017 1:15 PM  Type of Therapy: Group Therapy- Emotion Regulation  Participation Level: Pt invited. Did not attend.  Jonathon JordanLynn B Elsia Lasota, MSW, Theresia MajorsLCSWA [phone] 04/26/2017 4:30 PM

## 2017-04-26 NOTE — Progress Notes (Signed)
D: Pt presents with flat affect and depressed mood. Pt endorses passive SI with no plan or intent. Pt denies active suicidal thoughts during assessment. Pt verbally contracts for safety. Pt endorses AVH. Pt reports command voices telling him to hurt himself. Pt stated, "if you stare long enough you will see it to". Pt could not elaborate on what it is that he sees.  A: Medications reviewed with pt. Medications administered as ordered per MD. Verbal support provided. Pt encouraged to report worsening AVH/SI. Pt encouraged to attend groups. 15 minute checks performed for safety.  R: Pt compliant with tx.

## 2017-04-26 NOTE — Progress Notes (Signed)
[facility] MD Progress Note  04/26/2017 11:10 AM Carl Mcdaniel  MRN:  [id number] Subjective:   36 yo AAM with history of SUD. Self presented for voluntary admission. Odd at presentation. Reports a feeling that something terrible is about to happen. Has been tormented by voices threatening to harm him. Has been feeling depressed since then. Feels he is being watched. Expressed suicidal thoughts without any definitive plans. Admits use of alcohol and THC. No UDS or BAL at admission.  Chart reviewed today. Patient discussed at team  Staff reports that he was in his room throughout yesterday. Was distressed by the voices yesterday evening and required PRN Haldol. Slept afterwards. He is tolerating recent medication adjustments well. No observable side effects. Continues to isolate self today. Has not been able to tolerate any group. Eats and drinks normally.  Seen today, says he feels marginally better today. The voices are much quieter and less frequent. No commands to hurt self as he can barely make out what they say. No thoughts of suicide today. No violent thoughts. No passivity of will/thought. Would like to wash his clothes today. Encouraged to attend unit groups and activities. Feels okay with a roommate now. No restlessness or any other side effects from his medications. Has not had any family visit. Says his family is in [place]. No kids, no relationship.   Principal Problem: Substance Induced Psychotic Disorder Diagnosis:   Patient Active Problem List   Diagnosis Date Noted  . Severe major depression without psychotic features (HCC) [F32.2] 04/22/2017  . Severe episode of recurrent major depressive disorder, with psychotic features (HCC) [F33.3]    Total Time spent with patient: 20 minutes  Past Psychiatric History: As in H&P  Past Medical History: History reviewed. No pertinent past medical history. History reviewed. No pertinent surgical history. Family History: History reviewed. No  pertinent family history. Family Psychiatric  History: As in H&P Social History:  History  Alcohol Use  . Yes     History  Drug Use  . Types: Amphetamines    Social History   Social History  . Marital status: Single    Spouse name: N/A  . Number of children: N/A  . Years of education: N/A   Social History Main Topics  . Smoking status: Current Every Day Smoker    Packs/day: 1.50    Types: Cigarettes  . Smokeless tobacco: Never Used  . Alcohol use Yes  . Drug use: Yes    Types: Amphetamines  . Sexual activity: Not Asked   Other Topics Concern  . None   Social History Narrative  . None   Additional Social History:    Pain Medications: SEE MAR Prescriptions: SEE MAR Over the Counter: SEE MAR History of alcohol / drug use?: Yes Longest period of sobriety (when/how long): unknown Negative Consequences of Use: Financial, Legal, Personal relationships Withdrawal Symptoms: Patient aware of relationship between substance abuse and physical/medical complications Name of Substance 1: alcohol 1 - Age of First Use: unspecified 1 - Amount (size/oz): 12 pack beer or more 1 - Frequency: daily 1 - Duration: years 1 - Last Use / Amount: 04/21/17 4 beers or more     Sleep: Fair  Appetite:  Fair  Current Medications: Current Facility-Administered Medications  Medication Dose Route Frequency Provider Last Rate Last Dose  . acetaminophen (TYLENOL) tablet 650 mg  650 mg Oral Q6H PRN [name], NP   650 mg at 04/23/17 0921  . alum & mag hydroxide-simeth (MAALOX/MYLANTA) 200-200-20 MG/5ML  suspension 30 mL  30 mL Oral Q4H PRN Nira ConnBerry, Jason A, NP      . ARIPiprazole (ABILIFY) tablet 20 mg  20 mg Oral Daily Cartier Mapel A, MD   20 mg at 04/26/17 0828  . haloperidol (HALDOL) tablet 5 mg  5 mg Oral Q6H PRN Donell SievertSimon, Spencer E, PA-C   5 mg at 04/25/17 1958   And  . benztropine (COGENTIN) tablet 1 mg  1 mg Oral Q6H PRN Donell SievertSimon, Spencer E, PA-C   1 mg at 04/25/17 1957  . magnesium  hydroxide (MILK OF MAGNESIA) suspension 30 mL  30 mL Oral Daily PRN Nira ConnBerry, Jason A, NP      . multivitamin with minerals tablet 1 tablet  1 tablet Oral Daily Nira ConnBerry, Jason A, NP   1 tablet at 04/26/17 [id number]  . nicotine (NICODERM CQ - dosed in mg/24 hours) patch 21 mg  21 mg Transdermal Daily [name], Rockey SituFernando A, MD   21 mg at 04/26/17 [id number]  . sertraline (ZOLOFT) tablet 50 mg  50 mg Oral Daily [name], Rockey SituFernando A, MD   50 mg at 04/26/17 [id number]  . thiamine (VITAMIN B-1) tablet 100 mg  100 mg Oral Daily Nira ConnBerry, Jason A, NP   100 mg at 04/26/17 [id number]  . traZODone (DESYREL) tablet 100 mg  100 mg Oral QHS PRN Kerry HoughSimon, Spencer E, PA-C   100 mg at 04/24/17 2046    Lab Results: No results found for this or any previous visit (from the past 48 hour(s)).  Blood Alcohol level:  [facility] found for: [facility]  Metabolic Disorder Labs: Lab Results  Component Value Date   HGBA1C 5.9 (H) 04/22/2017   MPG 123 04/22/2017   No results found for: PROLACTIN Lab Results  Component Value Date   CHOL 169 04/22/2017   TRIG 171 (H) 04/22/2017   HDL 39 (L) 04/22/2017   CHOLHDL 4.3 04/22/2017   VLDL 34 04/22/2017   LDLCALC 96 04/22/2017    Physical Findings: AIMS: Facial and Oral Movements Muscles of Facial Expression: None, normal Lips and Perioral Area: None, normal Jaw: None, normal Tongue: None, normal,Extremity Movements Upper (arms, wrists, hands, fingers): None, normal Lower (legs, knees, ankles, toes): None, normal, Trunk Movements Neck, shoulders, hips: None, normal, Overall Severity Severity of abnormal movements (highest score from questions above): None, normal Incapacitation due to abnormal movements: None, normal Patient's awareness of abnormal movements (rate only patient's report): No Awareness, Dental Status Current problems with teeth and/or dentures?: No Does patient usually wear dentures?: No  CIWA:  CIWA-Ar Total: 0 COWS:  COWS Total Score: 1  Musculoskeletal: Strength & Muscle Tone: within  normal limits Gait & Station: normal Patient leans: N/A  Psychiatric Specialty Exam: Physical Exam  Constitutional: He is oriented to person, place, and time. He appears well-developed and well-nourished.  HENT:  Head: Normocephalic and atraumatic.  Eyes: Conjunctivae are normal. Pupils are equal, round, and reactive to light.  Neck: Normal range of motion. Neck supple.  Cardiovascular: Normal rate and regular rhythm.   Respiratory: Effort normal and breath sounds normal.  GI: Soft. Bowel sounds are normal.  Musculoskeletal: Normal range of motion.  Neurological: He is alert and oriented to person, place, and time.  Skin: Skin is warm and dry.  Psychiatric:  As above    ROS  Blood pressure 122/77, pulse 73, temperature 97.9 F (36.6 C), temperature source Oral, resp. rate 16, height 6' (1.829 m), weight 80.3 kg (177 lb), SpO2 97 %.Body mass index is 24.01 kg/m.  General Appearance: In hospital scrub, not internally distracted. More aware of his environment. Better rapport and engagement. Makes more eye contact today. No evidence of EPS.  Eye Contact:  Better  Speech:  More spontaneous. Not pressured or loud.   Volume:  Improved   Mood:  Feels marginally better   Affect:  Restricted but appropriate.   Thought Process:  Linear and logical  Orientation:  Full (Time, Place, and Person)  Thought Content: No delusional theme. No longer feels as if anyone is out to get him. Much less auditory hallucination. Did not appear distracted during interview. No preoccupation with violent thoughts.    Suicidal Thoughts:  None today  Homicidal Thoughts:  No  Memory:  Did not assess today  Judgement:  Better  Insight:  Partial   Psychomotor Activity:  Decreased  Concentration:  Impaired  Recall: did not assess today  Fund of Knowledge:  Fair  Language:  Good  Akathisia:  No  Handed:    AIMS (if indicated):     Assets:  Desire for Improvement  ADL's:  Impaired  Cognition:  WNL  Sleep:   Number of Hours: 6.25     Treatment Plan Summary: Psychosis is responding to treatment. Delusional atmosphere has lifted. No longer perceives others as a threat. States he can tolerate a roommate now. Beginning to resume interest in self care. We would continue medications at current dose and evaluate him further.   Psychiatric: Substance Induced Psychotic Disorder SUD  Medical:  Psychosocial:  Limited social support  PLAN: 1. Continue medications at current dose. 2. Encourage unit groups and activities 3. Continue to monitor mood, behavior and interaction with peers 4. Encourage patient to groom self. 5. Can have a roommate.    Georgiann Cocker, MD 04/26/2017, 11:10 AMPatient ID: Carl Mcdaniel, male   DOB: January 05, 1981, 36 y.o.   MRN: [id number]

## 2017-04-26 NOTE — Progress Notes (Signed)
Recreation Therapy Notes  Date: 04/26/17 Time: 0925 Location: 300 [address] Dayroom  Group Topic: Stress Management  Goal Area(s) Addresses:  Patient will verbalize importance of using healthy stress management.  Patient will identify positive emotions associated with healthy stress management.   Intervention: Stress Management  Activity :  Progressive Muscle Relaxation.  LRT introduced the stress management technique of progressive muscle relaxation.  LRT read a script to guide patients through the transitions to relax each muscle individually.  Patients were to follow along as LRT read script to engage in activity.  Education:  Stress Management, Discharge Planning.   Education Outcome: Acknowledges edcuation/In group clarification offered/Needs additional education  Clinical Observations/Feedback: Pt did not attend group.   Anhelica Fowers, LRT/CTRS         Kim Oki A 04/26/2017 11:45 AM 

## 2017-04-26 NOTE — Progress Notes (Signed)
Pt remained in his room all evening.  He reports that he is still hearing voices telling him to hurt himself.  He says the voices bother him more that withdrawal symptoms.  He says that he is unable to rest because of them.  Pt states he still has passive suicidal thoughts.  He denies HI.  Pt says it is difficult to be out in the milieu around others as he is suspicious of others.  Support and encouragement offered.  Pt was given Haldol 5 mg and Cogentin 1 mg prn per orders alone with his scheduled Abilify 5 mg.  Discharge plans are in process.  Pt is monitored for safety q15 minutes.

## 2017-04-26 NOTE — Progress Notes (Signed)
Patient ID: Carl Mcdaniel, male   DOB: 1981-03-14, 36 y.o.   MRN: [id number]  Pt currently presents with a flat affect and depressed behavior. Pt reports to writer that they were unable to identify a goal today. Pt states "I would like to talk to the doctor tomorrow if possible." Pt reports good sleep with current medication regimen.   Pt provided with medications per providers orders. Pt's labs and vitals were monitored throughout the night. Pt given a 1:1 about emotional and mental status. Pt supported and encouraged to express concerns and questions. Pt educated on medications.  Pt's safety ensured with 15 minute and environmental checks. Pt currently denies SI/HI and A/V hallucinations. Pt verbally agrees to seek staff if SI/HI or A/VH occurs and to consult with staff before acting on any harmful thoughts. Pt remains in dayroom today but did not interact with other peers. Will continue POC.

## 2017-04-27 NOTE — Plan of Care (Signed)
Problem: Activity: Goal: Interest or engagement in activities will improve Outcome: Progressing Pt has been out of his room interacting more with peers today.

## 2017-04-27 NOTE — Progress Notes (Addendum)
Patient ID: Carl Mcdaniel, male   DOB: Mar 24, 1981, 36 y.o.   MRN: [id number]  Pt currently presents with a flat affect and cooeprative behavior. Pt reports to writer that their goal is to "be more active today" and he feels like he accomplished this goal by going outside and to [place] today. Pt states "I have been hearing less of the mubling." Reports medications are working for him. Pt is worried about contacting a ride for discharge tomorrow. Pt reports good sleep with current medication regimen.   Pt provided with medications per providers orders. Pt's labs and vitals were monitored throughout the night. Pt given a 1:1 about emotional and mental status. Pt supported and encouraged to express concerns and questions. Pt educated [facility] medications and [facility].   Pt's safety ensured with 15 minute and environmental checks. Pt currently denies SI/HI and visual hallucinations. Endorses chronic AH. Pt verbally agrees to seek staff if SI/HI or VH occurs, AH worsens and to consult with staff before acting on any harmful thoughts. Will continue POC.

## 2017-04-27 NOTE — Progress Notes (Signed)
Pt attended N/A group for night group. [name]/MHT 

## 2017-04-27 NOTE — Progress Notes (Signed)
D:Pt rates depression and anxiety as a 6 on 0-10 scale with 10 being the most. Pt reports that he has had no voices today and says that it is related to his medications. He went outside with his peers and denies pain at this time. Pt requested clothes as he is wearing scrubs. Writer checked the closet and there is nothing in his size. Writer left a message for SW. A:Offered support and 15 minute checks. R:Pt verbally denies si and hi. Safety maintained on the unit.

## 2017-04-27 NOTE — [facility] Group Notes (Signed)
The focus of this group is to educate the patient on the purpose and policies of crisis stabilization and provide a format to answer questions about their admission.  The group details unit policies and expectations of patients while admitted.  Patient did not attend 0900 nurse education orientation group this morning.  Patient stayed in bed.   

## 2017-04-27 NOTE — Progress Notes (Signed)
[facility]  04/27/2017 5:08 PM Carl DikeClinton E Mcdaniel  MRN:  [id number] Subjective:   36 yo AAM with history of SUD. Self presented for voluntary admission. Odd at presentation. Reports a feeling that something terrible is about to happen. Has been tormented by voices threatening to harm him. Has been feeling depressed since then. Feels he is being watched. Expressed suicidal thoughts without any definitive plans. Admits use of alcohol and THC. No UDS or BAL at admission.  Chart reviewed today. Patient discussed at team  Staff reports that he is now coming out. He has not been reporting any hallucinations. He is engaging appropriately with peers. No behavioral issues.   Seen today, continues to make progress. Says he is feels like his old self. Notes that it has been a long time he felt this way. No voices lately. No hallucination in any other modality. Says his energy is back to normal. He is interacting with his environment normally. Good reality testing. Notes the role of substances. Says he plans to attend meetings when he gets out of hospital. No suicidal thoughts. No thoughts of violence. Tolerates his medications well.  Feels well enough to go home tomorrow. Says he needs his phone so he can make arrangements for transport.  Principal Problem: Substance Induced Psychotic Disorder Diagnosis:   Patient Active Problem List   Diagnosis Date Noted  . Severe major depression without psychotic features (HCC) [F32.2] 04/22/2017  . Severe episode of recurrent major depressive disorder, with psychotic features (HCC) [F33.3]    Total Time spent with patient: 20 minutes  Past Psychiatric History: As in H&P  Past Medical History: History reviewed. No pertinent past medical history. History reviewed. No pertinent surgical history. Family History: History reviewed. No pertinent family history. Family Psychiatric  History: As in H&P Social History:  History  Alcohol Use  . Yes     History   Drug Use  . Types: Amphetamines    Social History   Social History  . Marital status: Single    Spouse name: N/A  . Number of children: N/A  . Years of education: N/A   Social History Main Topics  . Smoking status: Current Every Day Smoker    Packs/day: 1.50    Types: Cigarettes  . Smokeless tobacco: Never Used  . Alcohol use Yes  . Drug use: Yes    Types: Amphetamines  . Sexual activity: Not Asked   Other Topics Concern  . None   Social History Narrative  . None   Additional Social History:    Pain Medications: SEE MAR Prescriptions: SEE MAR Over the Counter: SEE MAR History of alcohol / drug use?: Yes Longest period of sobriety (when/how long): unknown Negative Consequences of Use: Financial, Legal, Personal relationships Withdrawal Symptoms: Patient aware of relationship between substance abuse and physical/medical complications Name of Substance 1: alcohol 1 - Age of First Use: unspecified 1 - Amount (size/oz): 12 pack beer or more 1 - Frequency: daily 1 - Duration: years 1 - Last Use / Amount: 04/21/17 4 beers or more     Sleep: Fair  Appetite:  Fair  Current Medications: Current Facility-Administered Medications  Medication Dose Route Frequency Provider Last Rate Last Dose  . acetaminophen (TYLENOL) tablet 650 mg  650 mg Oral Q6H PRN Nira ConnBerry, Jason A, NP   650 mg at 04/23/17 0921  . alum & mag hydroxide-simeth (MAALOX/MYLANTA) 200-200-20 MG/5ML suspension 30 mL  30 mL Oral Q4H PRN Jackelyn PolingBerry, Jason A, NP      .  ARIPiprazole (ABILIFY) tablet 20 mg  20 mg Oral Daily Izediuno, Vincent A, MD   20 mg at 04/27/17 4540  . haloperidol (HALDOL) tablet 5 mg  5 mg Oral Q6H PRN Donell Sievert E, PA-C   5 mg at 04/25/17 1958   And  . benztropine (COGENTIN) tablet 1 mg  1 mg Oral Q6H PRN Donell Sievert E, PA-C   1 mg at 04/25/17 1957  . magnesium hydroxide (MILK OF MAGNESIA) suspension 30 mL  30 mL Oral Daily PRN [name], NP      . multivitamin with minerals tablet  1 tablet  1 tablet Oral Daily [name], NP   1 tablet at 04/27/17 [phone]  . nicotine (NICODERM CQ - dosed in mg/24 hours) patch 21 mg  21 mg Transdermal Daily [name], [name], MD   21 mg at 04/27/17 9147  . sertraline (ZOLOFT) tablet 50 mg  50 mg Oral Daily [name], [name], MD   50 mg at 04/27/17 8295  . thiamine (VITAMIN B-1) tablet 100 mg  100 mg Oral Daily [name], NP   100 mg at 04/27/17 6213  . traZODone (DESYREL) tablet 100 mg  100 mg Oral QHS PRN Kerry Hough, PA-C   100 mg at 04/24/17 2046    Lab Results: No results found for this or any previous visit (from the past 48 hour(s)).  Blood Alcohol level:  No results found for: [facility]  Metabolic Disorder Labs: Lab Results  Component Value Date   HGBA1C 5.9 (H) 04/22/2017   MPG 123 04/22/2017   No results found for: PROLACTIN Lab Results  Component Value Date   CHOL 169 04/22/2017   TRIG 171 (H) 04/22/2017   HDL 39 (L) 04/22/2017   CHOLHDL 4.3 04/22/2017   VLDL 34 04/22/2017   LDLCALC 96 04/22/2017    Physical Findings: AIMS: Facial and Oral Movements Muscles of Facial Expression: None, normal Lips and Perioral Area: None, normal Jaw: None, normal Tongue: None, normal,Extremity Movements Upper (arms, wrists, hands, fingers): None, normal Lower (legs, knees, ankles, toes): None, normal, Trunk Movements Neck, shoulders, hips: None, normal, Overall Severity Severity of abnormal movements (highest score from questions above): None, normal Incapacitation due to abnormal movements: None, normal Patient's awareness of abnormal movements (rate only patient's report): No Awareness, Dental Status Current problems with teeth and/or dentures?: No Does patient usually wear dentures?: No  CIWA:  CIWA-Ar Total: 0 COWS:  COWS Total Score: 1  Musculoskeletal: Strength & Muscle Tone: within normal limits Gait & Station: normal Patient leans: N/A  Psychiatric Specialty Exam: Physical Exam  Constitutional: He is  oriented to person, place, and time. He appears well-developed and well-nourished.  HENT:  Head: Normocephalic and atraumatic.  Eyes: Conjunctivae are normal. Pupils are equal, round, and reactive to light.  Neck: Normal range of motion. Neck supple.  Cardiovascular: Normal rate and regular rhythm.   Respiratory: Effort normal and breath sounds normal.  GI: Soft. Bowel sounds are normal.  Musculoskeletal: Normal range of motion.  Neurological: He is alert and oriented to person, place, and time.  Skin: Skin is warm and dry.  Psychiatric:  As above    ROS  Blood pressure 133/85, pulse 80, temperature 98.2 F (36.8 C), temperature source Oral, resp. rate 18, height 6' (1.829 m), weight 80.3 kg (177 lb), SpO2 97 %.Body mass index is 24.01 kg/m.  General Appearance: Pleasant and polite. Grateful for help provided. Appropriate behavior. Not internally distressed. No evidence of EPS.  Eye Contact:  Good  Speech: Spontaneous, normal prosody. Normal tone and rate.   Volume:  Normal  Mood:  Euthymic  Affect:  Restricted but appropriate.   Thought Process:  Linear and logical  Orientation:  Full (Time, Place, and Person)  Thought Content: No delusional theme. No preoccupation with violent thoughts. No negative ruminations. No obsession.  No hallucination in any modality.     Suicidal Thoughts:  No  Homicidal Thoughts:  No  Memory:  WNL  Judgement:  Good  Insight:  Good  Psychomotor Activity:  Normal  Concentration:  WNL  Recall: WNL  Fund of Knowledge:  Good  Language:  Good  Akathisia:  No  Handed:    AIMS (if indicated):     Assets:  Desire for Improvement  ADL's:  Impaired  Cognition:  WNL  Sleep:  Number of Hours: 6.25     Treatment Plan Summary: Psychosis has resolved completely. Patient is not a danger to himself or others. We are finalizing aftercare. Hopeful discharge tomorrow.  Psychiatric: Substance Induced Psychotic Disorder SUD  Medical:  Psychosocial:   Limited social support  PLAN: 1. Continue medications at current dose. 2. Continue to monitor mood, behavior and interaction with peers 3. Home tomorrow.    Georgiann Cocker, MD 04/27/2017, 5:08 PMPatient ID: Carl Mcdaniel, male   DOB: 04-17-1981, 36 y.o.   MRN: [id number] Patient ID: ELIH MOONEY, male   DOB: Aug 08, 1981, 36 y.o.   MRN: [id number]

## 2017-04-27 NOTE — Progress Notes (Signed)
Pt attended the evening karaoke group. Pt was engaged and supportive but did not participate by singing a song.

## 2017-04-28 MED ORDER — NICOTINE 21 MG/24HR TD PT24: 21.0000 mg | MEDICATED_PATCH | Freq: Every day | TRANSDERMAL | 0 refills | Status: AC

## 2017-04-28 MED ORDER — TRAZODONE HCL 100 MG PO TABS: 100.0000 mg | ORAL_TABLET | Freq: Every evening | ORAL | 0 refills | Status: AC | PRN

## 2017-04-28 MED ORDER — ARIPIPRAZOLE 20 MG PO TABS: 20.0000 mg | ORAL_TABLET | Freq: Every day | ORAL | 0 refills | Status: DC

## 2017-04-28 MED ORDER — SERTRALINE HCL 50 MG PO TABS: 50.0000 mg | ORAL_TABLET | Freq: Every day | ORAL | 0 refills | Status: DC

## 2017-04-28 NOTE — Progress Notes (Signed)
  [facility] Discharge Plan :  Will you be returning to the same living situation after discharge:  Yes,  home At discharge, do you have transportation home?: Yes,  friend/family member Do you have the ability to pay for your medications: Yes,  mental health  Release of information consent forms completed and submitted to medical records by CSW.  Patient to Follow up at: Follow-up Information    [facility] Follow up on 05/02/2017.   Why:  Hospital follow-up on Tuesday, 05/02/17 at 9:45AM. Please bring the following: Photo ID, Social Security Card, and any proof of income if you have it. Thank you.  Contact information: [address] [place] [address] [phone]           Next level of care provider has access to [facility] Link:no  Safety Planning and Suicide Prevention discussed: Yes,  SPE completed with pt; pt declined to consent to family contact. SPI pamphlet and Mobile Crisis information provided to pt.  Have you used any form of tobacco in the last 30 days? (Cigarettes, Smokeless Tobacco, Cigars, and/or Pipes): Yes  Has patient been referred to the Quitline?: Patient refused referral  Patient has been referred for addiction treatment: Yes  Brinley Rosete N Smart LCSW 04/28/2017, 9:05 AM

## 2017-04-28 NOTE — [facility] Suicide Risk Assessment (Signed)
[facility] Discharge Suicide Risk Assessment   Principal Problem: Severe episode of recurrent major depressive disorder, with psychotic features [facility]) Discharge Diagnoses:  Patient Active Problem List   Diagnosis Date Noted  . Severe major depression without psychotic features (HCC) [F32.2] 04/22/2017  . Severe episode of recurrent major depressive disorder, with psychotic features (HCC) [F33.3]     Total Time spent with patient: 45 minutes  Musculoskeletal: Strength & Muscle Tone: within normal limits Gait & Station: normal Patient leans: N/A  Psychiatric Specialty Exam: Review of Systems  Constitutional: Negative.   HENT: Negative.   Eyes: Negative.   Respiratory: Negative.   Cardiovascular: Negative.   Gastrointestinal: Negative.   Genitourinary: Negative.   Musculoskeletal: Negative.   Skin: Negative.   Neurological: Negative.   Endo/Heme/Allergies: Negative.   Psychiatric/Behavioral: Negative for depression, hallucinations, memory loss, substance abuse and suicidal ideas. The patient is not nervous/anxious and does not have insomnia.     Blood pressure (!) 122/93, pulse 77, temperature 97.8 F (36.6 C), temperature source Oral, resp. rate 20, height 6' (1.829 m), weight 80.3 kg (177 lb), SpO2 97 %.Body mass index is 24.01 kg/m.  General Appearance: Neatly dressed, pleasant, engaging well and cooperative. Appropriate behavior. Not in any distress. Good relatedness. Not internally stimulated  Eye Contact::  Good  Speech:  Spontaneous, normal prosody. Normal tone and rate.   Volume:  Normal  Mood:  Euthymic  Affect:  Appropriate  Thought Process:  Goal Directed and Linear  Orientation:  Full (Time, Place, and Person)  Thought Content:  No delusional theme. No preoccupation with violent thoughts. No negative ruminations. No obsession.  No hallucination in any modality.   Suicidal Thoughts:  No  Homicidal Thoughts:  No  Memory:  Immediate;   Good Recent;   Good Remote;    Good  Judgement:  Good  Insight:  Good  Psychomotor Activity:  Normal  Concentration:  Good  Recall:  Good  Fund of Knowledge:Good  Language: Good  Akathisia:  Negative  Handed:    AIMS (if indicated):     Assets:  Communication Skills Desire for Improvement Physical Health Resilience Social Support  Sleep:  Number of Hours: 2.75  Cognition: WNL  ADL's:  Intact   Clinical  Assessment::   36 yo AAM with history of SUD. Self presented for voluntary admission. Odd at presentation. Reports a feeling that something terrible is about to happen. Has been tormented by voices threatening to harm him. Has been feeling depressed since then. Feels he is being watched. Expressed suicidal thoughts without any definitive plans. Admits use of alcohol and THC. No UDS or BAL at admission.  Seen today. Says he feels good. He had a shower this morning and just came back from the gymn. Says he has not heard any voices lately. No hallucination in any other modality. No persecution. No other delusional theme. No passivity phenomena. Says he did not sleep well last night as he was worried about problems his lifestyle has caused him. Not willing to divulge this as it might get him in trouble. States that there is no legal ramifications at this time. Says he typically drinks every night at home in order to get into sleep. Says he would be staying with a friend here in [place]. Worries about the change kept him up most of the night. No thoughts of suicide. No thoughts of homicide. Says he hopes to maintain this change in his lifestyle.   Nursing staff reports that patient has been appropriate  on the unit. Patient has been interacting well with peers. No behavioral issues. Patient has not voiced any suicidal thoughts. Patient has not been observed to be internally stimulated. Patient has been adherent with treatment recommendations. Patient has been tolerating their medication well.   Patient was discussed at  team. Team members feels that patient is back to his baseline level of function. Team agrees with plan to discharge patient today.   Demographic Factors:  Male and Unemployed  Loss Factors: Financial problems/change in socioeconomic status  Historical Factors: Impulsivity  Risk Reduction Factors:   Living with another person, especially a relative, Positive social support, Positive therapeutic relationship and Positive coping skills or problem solving skills  Continued Clinical Symptoms:  As above  Cognitive Features That Contribute To Risk:  None    Suicide Risk:  Minimal: No identifiable suicidal ideation. Patient is not having any thoughts of suicide at this time. Modifiable risk factors targeted during this admission includes psychosis and substance use. Demographical and historical risk factors cannot be modified. Patient is now engaging well. Patient is reliable and is future oriented. We have buffered patient's support structures. At this point, patient is at low risk of suicide. Patient is aware of the effects of psychoactive substances on decision making process. Patient has been provided with emergency contacts. Patient acknowledges to use resources provided if unforseen circumstances changes their current risk stratification.    Follow-up Information    [facility] Follow up on 05/02/2017.   Why:  Hospital follow-up on Tuesday, 05/02/17 at 9:45AM. Please bring the following: Photo ID, Social Security Card, and any proof of income if you have it. Thank you.  Contact information: [address] [place] [id number] [phone]           Plan Of Care/Follow-up recommendations:  1. Continue current psychotropic medications 2. Mental health and addiction follow up as arranged.  3. Provided limited quantity of prescriptions   Georgiann CockerVincent A Anniece Bleiler, MD 04/28/2017, 10:57 AM

## 2017-04-28 NOTE — Tx Team (Signed)
Interdisciplinary Treatment and Diagnostic Plan Update  04/28/2017 Time of Session: [place] MRN: [id number]  Principal Diagnosis: Severe episode of recurrent major depressive disorder, with psychotic features ([place])  Secondary Diagnoses: Principal Problem:   Severe episode of recurrent major depressive disorder, with psychotic features ([place]) Active Problems:   Severe major depression without psychotic features ([place])   Current Medications:  Current Facility-Administered Medications  Medication Dose Route Frequency Provider Last Rate Last Dose  . acetaminophen (TYLENOL) tablet 650 mg  650 mg Oral Q6H PRN Lindon Romp A, NP   650 mg at 04/23/17 0921  . alum & mag hydroxide-simeth (MAALOX/MYLANTA) 200-200-20 MG/5ML suspension 30 mL  30 mL Oral Q4H PRN Lindon Romp A, NP      . ARIPiprazole (ABILIFY) tablet 20 mg  20 mg Oral Daily Izediuno, Vincent A, MD   20 mg at 04/28/17 0818  . haloperidol (HALDOL) tablet 5 mg  5 mg Oral Q6H PRN Patriciaann Clan E, PA-C   5 mg at 04/25/17 1958   And  . benztropine (COGENTIN) tablet 1 mg  1 mg Oral Q6H PRN Patriciaann Clan E, PA-C   1 mg at 04/25/17 1957  . magnesium hydroxide (MILK OF MAGNESIA) suspension 30 mL  30 mL Oral Daily PRN Lindon Romp A, NP      . multivitamin with minerals tablet 1 tablet  1 tablet Oral Daily Lindon Romp A, NP   1 tablet at 04/28/17 0818  . nicotine (NICODERM CQ - dosed in mg/24 hours) patch 21 mg  21 mg Transdermal Daily [name], Myer Peer, MD   21 mg at 04/28/17 0818  . sertraline (ZOLOFT) tablet 50 mg  50 mg Oral Daily [name], Myer Peer, MD   50 mg at 04/28/17 0818  . thiamine (VITAMIN B-1) tablet 100 mg  100 mg Oral Daily Lindon Romp A, NP   100 mg at 04/28/17 0818  . traZODone (DESYREL) tablet 100 mg  100 mg Oral QHS PRN Laverle Hobby, PA-C   100 mg at 04/27/17 2245   PTA Medications: Prescriptions Prior to Admission  Medication Sig Dispense Refill Last Dose  . Aspirin-Acetaminophen (GOODYS BODY PAIN PO)  Take 1 tablet by mouth every 6 (six) hours as needed (back pain).   Past Month at Unknown time    Patient Stressors: Financial difficulties Medication change or noncompliance Occupational concerns Substance abuse  Patient Strengths: Capable of independent living General fund of knowledge  Treatment Modalities: Medication Management, Group therapy, Case management,  1 to 1 session with clinician, Psychoeducation, Recreational therapy.   Physician Treatment Plan for Primary Diagnosis: Severe episode of recurrent major depressive disorder, with psychotic features ([place]) Long Term Goal(s): Improvement in symptoms so as ready for discharge Improvement in symptoms so as ready for discharge   Short Term Goals: Ability to identify changes in lifestyle to reduce recurrence of condition will improve Ability to verbalize feelings will improve Ability to disclose and discuss suicidal ideas Ability to identify and develop effective coping behaviors will improve Ability to identify and develop effective coping behaviors will improve Ability to maintain clinical measurements within normal limits will improve Ability to identify triggers associated with substance abuse/mental health issues will improve  Medication Management: Evaluate patient's response, side effects, and tolerance of medication regimen.  Therapeutic Interventions: 1 to 1 sessions, Unit Group sessions and Medication administration.  Evaluation of Outcomes: Met  Physician Treatment Plan for Secondary Diagnosis: Principal Problem:   Severe episode of recurrent major depressive disorder, with psychotic  features [facility]) Active Problems:   Severe major depression without psychotic features ([place])  Long Term Goal(s): Improvement in symptoms so as ready for discharge Improvement in symptoms so as ready for discharge   Short Term Goals: Ability to identify changes in lifestyle to reduce recurrence of condition will improve Ability to  verbalize feelings will improve Ability to disclose and discuss suicidal ideas Ability to identify and develop effective coping behaviors will improve Ability to identify and develop effective coping behaviors will improve Ability to maintain clinical measurements within normal limits will improve Ability to identify triggers associated with substance abuse/mental health issues will improve     Medication Management: Evaluate patient's response, side effects, and tolerance of medication regimen.  Therapeutic Interventions: 1 to 1 sessions, Unit Group sessions and Medication administration.  Evaluation of Outcomes: Met  RN Treatment Plan for Primary Diagnosis: Severe episode of recurrent major depressive disorder, with psychotic features ([place]) Long Term Goal(s): Knowledge of disease and therapeutic regimen to maintain health will improve  Short Term Goals: Ability to remain free from injury will improve, Ability to disclose and discuss suicidal ideas and Ability to identify and develop effective coping behaviors will improve  Medication Management: RN will administer medications as ordered by provider, will assess and evaluate patient's response and provide education to patient for prescribed medication. RN will report any adverse and/or side effects to prescribing provider.  Therapeutic Interventions: 1 on 1 counseling sessions, Psychoeducation, Medication administration, Evaluate responses to treatment, Monitor vital signs and CBGs as ordered, Perform/monitor CIWA, COWS, AIMS and Fall Risk screenings as ordered, Perform wound care treatments as ordered.  Evaluation of Outcomes: Met  LCSW Treatment Plan for Primary Diagnosis: Severe episode of recurrent major depressive disorder, with psychotic features ([place]) Long Term Goal(s): Safe transition to appropriate next level of care at discharge, Engage patient in therapeutic group addressing interpersonal concerns.  Short Term Goals: Engage  patient in aftercare planning with referrals and resources, Facilitate patient progression through stages of change regarding substance use diagnoses and concerns and Identify triggers associated with mental health/substance abuse issues  Therapeutic Interventions: Assess for all discharge needs, 1 to 1 time with Social worker, Explore available resources and support systems, Assess for adequacy in community support network, Educate family and significant other(s) on suicide prevention, Complete Psychosocial Assessment, Interpersonal group therapy.  Evaluation of Outcomes: Met   Progress in Treatment: Attending groups: Yes. Participating in groups: Yes. Taking medication as prescribed: Yes. Toleration medication: Yes. Family/Significant other contact made: SPE completed with pt; pt declined to consent to family contact. Patient understands diagnosis: Yes. Discussing patient identified problems/goals with staff: Yes. Medical problems stabilized or resolved: Yes. Denies suicidal/homicidal ideation: Yes. Issues/concerns per patient self-inventory: No. Other: n/a  New problem(s) identified: No, Describe:  n/a  New Short Term/Long Term Goal(s): elimination of SI thoughts; medication stabilization; detox; development of comprehensive mental wellness/sobriety plan.   Discharge Plan or Barriers: Pt plans to return home; follow-up made at [facility] for Tuesday at 9:45AM. Pt also provided with [place] [facility] [employer] and AA/NA information for [employer].   Reason for Continuation of Hospitalization: none  Estimated Length of Stay: discharge today   Attendees: Patient: 04/28/2017 9:06 AM  Physician: Dr. Sanjuana Letters MD 04/28/2017 9:06 AM  Nursing: Babette Relic RN 04/28/2017 9:06 AM  RN Care Manager: Lars Pinks CM 04/28/2017 9:06 AM  Social Worker: Press photographer, LCSW; Matthew Saras [place] 04/28/2017 9:06 AM  Recreational Therapist: x 04/28/2017 9:06 AM  Other: Herbert Pun  [facility] NP; Samuel Jester NP 04/28/2017 9:06 AM  Other:  04/28/2017 9:06 AM  Other: 04/28/2017 9:06 AM    Scribe for Treatment Team: [place], LCSW 04/28/2017 9:06 AM

## 2017-04-28 NOTE — Discharge Summary (Signed)
Physician Discharge Summary Note  Patient:  Carl Mcdaniel is an 36 y.o., male MRN:  [id number] DOB:  1980/12/22 Patient phone:  [phone] (home)  Patient address:   [address] [place] [address],  Total Time spent with patient: 30 minutes  Date of Admission:  04/22/2017 Date of Discharge: 04/28/2017  Reason for Admission:  Worsening depression  Principal Problem: Severe episode of recurrent major depressive disorder, with psychotic features [facility]) Discharge Diagnoses: Patient Active Problem List   Diagnosis Date Noted  . Severe major depression without psychotic features (HCC) [F32.2] 04/22/2017  . Severe episode of recurrent major depressive disorder, with psychotic features (HCC) [F33.3]     Past Psychiatric History: see HPI  Past Medical History: History reviewed. No pertinent past medical history. History reviewed. No pertinent surgical history. Family History: History reviewed. No pertinent family history. Family Psychiatric  History: see HPI Social History:  History  Alcohol Use  . Yes     History  Drug Use  . Types: Amphetamines    Social History   Social History  . Marital status: Single    Spouse name: N/A  . Number of children: N/A  . Years of education: N/A   Social History Main Topics  . Smoking status: Current Every Day Smoker    Packs/day: 1.50    Types: Cigarettes  . Smokeless tobacco: Never Used  . Alcohol use Yes  . Drug use: Yes    Types: Amphetamines  . Sexual activity: Not Asked   Other Topics Concern  . None   Social History Narrative  . None    Hospital Course:  Carl Mcdaniel an 36 y.o.male, stated that his  depression has become worse and lost motivation to keep on living.  Patient also has hx of multiple stressors/trauma including a friend x 2 years ago that commited suicide.    Carl Mcdaniel was admitted for Severe episode of recurrent major depressive disorder, with psychotic features (HCC) and crisis  management.  Patient was treated with medications with their indications listed below in detail under Medication List.  Medical problems were identified and treated as needed.  Home medications were restarted as appropriate.  Improvement was monitored by observation and Carl Mcdaniel daily report of symptom reduction.  Emotional and mental status was monitored by daily self inventory reports completed by Carl Mcdaniel and clinical staff.  Patient reported continued improvement, denied any new concerns.  Patient had been compliant on medications and denied side effects.  Support and encouragement was provided.         Carl Mcdaniel was evaluated by the treatment team for stability and plans for continued recovery upon discharge.  Patient was offered further treatment options upon discharge including Residential, Intensive Outpatient and Outpatient treatment. Patient will follow up with agency listed below for medication management and counseling.  Encouraged patient to maintain satisfactory support network and home environment.  Advised to adhere to medication compliance and outpatient treatment follow up.  Prescriptions provided.       Carl Mcdaniel motivation was an integral factor for scheduling further treatment.  Employment, transportation, bed availability, health status, family support, and any pending legal issues were also considered during patient's hospital stay.  Upon completion of this admission the patient was both mentally and medically stable for discharge denying suicidal/homicidal ideation, auditory/visual/tactile hallucinations, delusional thoughts and paranoia.      Physical Findings: AIMS: Facial and Oral Movements Muscles of Facial Expression: None, normal Lips  and Perioral Area: None, normal Jaw: None, normal Tongue: None, normal,Extremity Movements Upper (arms, wrists, hands, fingers): None, normal Lower (legs, knees, ankles, toes): None, normal, Trunk  Movements Neck, shoulders, hips: None, normal, Overall Severity Severity of abnormal movements (highest score from questions above): None, normal Incapacitation due to abnormal movements: None, normal Patient's awareness of abnormal movements (rate only patient's report): No Awareness, Dental Status Current problems with teeth and/or dentures?: No Does patient usually wear dentures?: No  CIWA:  CIWA-Ar Total: 1 COWS:  COWS Total Score: 1  Musculoskeletal: Strength & Muscle Tone: within normal limits Gait & Station: normal Patient leans: N/A  Psychiatric Specialty Exam:  See MD SRA Physical Exam  Nursing note and vitals reviewed.   ROS  Blood pressure (!) 122/93, pulse 77, temperature 97.8 F (36.6 C), temperature source Oral, resp. rate 20, height 6' (1.829 m), weight 80.3 kg (177 lb), SpO2 97 %.Body mass index is 24.01 kg/m.   Have you used any form of tobacco in the last 30 days? (Cigarettes, Smokeless Tobacco, Cigars, and/or Pipes): Yes  Has this patient used any form of tobacco in the last 30 days? (Cigarettes, Smokeless Tobacco, Cigars, and/or Pipes) Yes, N/A  Blood Alcohol level:  No results found for: [facility]  Metabolic Disorder Labs:  Lab Results  Component Value Date   HGBA1C 5.9 (H) 04/22/2017   MPG 123 04/22/2017   No results found for: PROLACTIN Lab Results  Component Value Date   CHOL 169 04/22/2017   TRIG 171 (H) 04/22/2017   HDL 39 (L) 04/22/2017   CHOLHDL 4.3 04/22/2017   VLDL 34 04/22/2017   LDLCALC 96 04/22/2017    See Psychiatric Specialty Exam and Suicide Risk Assessment completed by Attending Physician prior to discharge.  Discharge destination:  Home  Is patient on multiple antipsychotic therapies at discharge:  No   Has Patient had three or more failed trials of antipsychotic monotherapy by history:  No  Recommended Plan for Multiple Antipsychotic Therapies: NA   Allergies as of 04/28/2017      Reactions   Other    Shell fish       Medication List    STOP taking these medications   GOODYS BODY PAIN PO     TAKE these medications     Indication  ARIPiprazole 20 MG tablet Commonly known as:  ABILIFY Take 1 tablet (20 mg total) by mouth daily. Start taking on:  04/29/2017  Indication:  Mixed Bipolar Affective Disorder   nicotine 21 mg/24hr patch Commonly known as:  NICODERM CQ - dosed in mg/24 hours Place 1 patch (21 mg total) onto the skin daily. Start taking on:  04/29/2017  Indication:  Nicotine Addiction   sertraline 50 MG tablet Commonly known as:  ZOLOFT Take 1 tablet (50 mg total) by mouth daily. Start taking on:  04/29/2017  Indication:  Major Depressive Disorder   traZODone 100 MG tablet Commonly known as:  DESYREL Take 1 tablet (100 mg total) by mouth at bedtime as needed for sleep.  Indication:  Trouble Sleeping      Follow-up [employer], [facility] Follow up on 05/02/2017.   Why:  Hospital follow-up on Tuesday, 05/02/17 at 9:45AM. Please bring the following: Photo ID, Social Security Card, and any proof of income if you have it. Thank you.  Contact information: [address] [place] [address] [phone]           Follow-up recommendations:  Activity:  as tol  Diet:  as tol  Comments:  1.  Take all your medications as prescribed.   2.  Report any adverse side effects to outpatient provider. 3.  Patient instructed to not use alcohol or illegal drugs while on prescription medicines. 4.  In the event of worsening symptoms, instructed patient to call 911, the crisis hotline or go to nearest emergency room for evaluation of symptoms.  Signed: Lindwood QuaSheila May Hartman Minahan, NP [facility] 04/28/2017, 1:25 PM

## 2017-04-28 NOTE — Progress Notes (Signed)
Pt d/c from the hospital. All items returned. D/C instructions given, samples given, bus pass given and prescriptions given. Pt denies si and hi.

## 2017-04-28 NOTE — Progress Notes (Signed)
Recreation Therapy Notes  Date: 04/28/17 Time: 0930 Location: 300 [address] Dayroom  Group Topic: Stress Management  Goal Area(s) Addresses:  Patient will verbalize importance of using healthy stress management.  Patient will identify positive emotions associated with healthy stress management.   Intervention: Stress Management  Activity :  Gratitude Meditation.  LRT introduced the stress management technique of meditation to patients.  LRT played a meditation from the Calm app to allow patients to engage in the technique.  Patients were to follow along as the meditation played to participate in the practice.  Education:  Stress Management, Discharge Planning.   Education Outcome: Acknowledges edcuation/In group clarification offered/Needs additional education  Clinical Observations/Feedback: Pt did not attend group.   Caroll RancherMarjette Kiandria Clum, LRT/CTRS         Lillia AbedLindsay, Lacora Folmer A 04/28/2017 11:30 AM

## 2017-11-06 ENCOUNTER — Encounter (HOSPITAL_COMMUNITY): Payer: Self-pay | Admitting: *Deleted

## 2017-11-06 ENCOUNTER — Other Ambulatory Visit: Payer: Self-pay

## 2017-11-06 ENCOUNTER — Emergency Department (HOSPITAL_COMMUNITY)
Admission: EM | Admit: 2017-11-06 | Discharge: 2017-11-06 | Disposition: A | Discharge status: Home / Self Care | Payer: Medicaid Other | Attending: Emergency Medicine | Admitting: Emergency Medicine

## 2017-11-06 ENCOUNTER — Emergency Department (HOSPITAL_COMMUNITY): Payer: Medicaid Other

## 2017-11-06 DIAGNOSIS — W010XXA Fall on same level from slipping, tripping and stumbling without subsequent striking against object, initial encounter: Secondary | ICD-10-CM | POA: Insufficient documentation

## 2017-11-06 DIAGNOSIS — Y999 Unspecified external cause status: Secondary | ICD-10-CM | POA: Diagnosis not present

## 2017-11-06 DIAGNOSIS — S8265XA Nondisplaced fracture of lateral malleolus of left fibula, initial encounter for closed fracture: Secondary | ICD-10-CM | POA: Insufficient documentation

## 2017-11-06 DIAGNOSIS — Z79899 Other long term (current) drug therapy: Secondary | ICD-10-CM | POA: Insufficient documentation

## 2017-11-06 DIAGNOSIS — S82892A Other fracture of left lower leg, initial encounter for closed fracture: Secondary | ICD-10-CM

## 2017-11-06 DIAGNOSIS — Y9389 Activity, other specified: Secondary | ICD-10-CM | POA: Insufficient documentation

## 2017-11-06 DIAGNOSIS — Y9289 Other specified places as the place of occurrence of the external cause: Secondary | ICD-10-CM | POA: Insufficient documentation

## 2017-11-06 DIAGNOSIS — F17210 Nicotine dependence, cigarettes, uncomplicated: Secondary | ICD-10-CM | POA: Insufficient documentation

## 2017-11-06 DIAGNOSIS — S99912A Unspecified injury of left ankle, initial encounter: Secondary | ICD-10-CM | POA: Diagnosis present

## 2017-11-06 MED ORDER — ACETAMINOPHEN 325 MG PO TABS
650.0000 mg | ORAL_TABLET | Freq: Once | ORAL | Status: AC
  Administered 2017-11-06: 650 mg via ORAL
  Filled 2017-11-06: qty 2

## 2017-11-06 MED ORDER — TRAMADOL HCL 50 MG PO TABS: 50.0000 mg | ORAL_TABLET | Freq: Four times a day (QID) | ORAL | 0 refills | Status: DC | PRN

## 2017-11-06 NOTE — ED Triage Notes (Signed)
PT states L ankle pain x 2 weeks and has been using crutches since Sat when he re-injured it.

## 2017-11-06 NOTE — Progress Notes (Signed)
Orthopedic Tech Progress Note Patient Details:  Victorino DikeClinton E Rane [birth year] [id number]  Ortho Devices Type of Ortho Device: Short leg splint Ortho Device/Splint Location: Left Ortho Device/Splint Interventions: Application   Post Interventions Patient Tolerated: Well Instructions Provided: Care of device   Alvina ChouWilliams, Jhonathan Desroches C 11/06/2017, 5:03 PM

## 2017-11-06 NOTE — ED Provider Notes (Signed)
[facility]ENC[facility] Provider Note   CSN: [id number] Arrival date & time: 11/06/17  1247     History   Chief Complaint Chief Complaint  Patient presents with  . Ankle Pain    HPI Carl Mcdaniel is a 36 y.o. male who presents the emergency department today for 2-week history of left ankle pain.  Patient notes that his initial injury of left ankle occurred while the patient while patient was trying to get out of the way of his new kitten, causing him to land on his left ankle inverted fashion.  He had pain along the lateral malleolus.  He is crutches and rest the area for several days by sitting on the couch and elevating his leg.  He also took ibuprofen several times per day.  Decreased swelling of starting to get better so the patient returned to work.  He works as a Management consultantunderground construction worker.  He notes that several days ago he was leaving work and slipped on some wet leaves, causing him to invert the left ankle again and reinjured.  He has been trying to elevate the leg and take it easy with the swelling along the lateral malleolus but the swelling would not go down.  He notes his pain is primarily on the lateral malleolus and is increased with movement and when he walks.  He has been using crutches to remain nonweightbearing and now has velcro brace on the area.  His pain is tolerated with ibuprofen to a 4/10.  Patient has not been seen or evaluated for this since initial injury. He feels there may be some paresthesias along part of his lateral malleolus. No difficulty with range of motion of the toes.   HPI  History reviewed. No pertinent past medical history.  Patient Active Problem List   Diagnosis Date Noted  . Severe major depression without psychotic features (HCC) 04/22/2017  . Severe episode of recurrent major depressive disorder, with psychotic features (HCC)     History reviewed. No pertinent surgical history.     Home Medications      Prior to Admission medications   Medication Sig Start Date End Date Taking? Authorizing Provider  ARIPiprazole (ABILIFY) 20 MG tablet Take 1 tablet (20 mg total) by mouth daily. 04/29/17   Adonis BrookAgustin, Sheila, NP  nicotine (NICODERM CQ - DOSED IN MG/24 HOURS) 21 mg/24hr patch Place 1 patch (21 mg total) onto the skin daily. 04/29/17   Adonis BrookAgustin, Sheila, NP  sertraline (ZOLOFT) 50 MG tablet Take 1 tablet (50 mg total) by mouth daily. 04/29/17   Adonis BrookAgustin, Sheila, NP  traZODone (DESYREL) 100 MG tablet Take 1 tablet (100 mg total) by mouth at bedtime as needed for sleep. 04/28/17   Adonis BrookAgustin, Sheila, NP    Family History No family history on file.  Social History Social History   Tobacco Use  . Smoking status: Current Every Day Smoker    Packs/day: 1.50    Types: Cigarettes  . Smokeless tobacco: Never Used  Substance Use Topics  . Alcohol use: Yes    Comment: 6 beers per day  . Drug use: Yes    Types: Amphetamines, Marijuana     Allergies   Other   Review of Systems Review of Systems  Constitutional: Negative for chills and fever.  Musculoskeletal: Positive for arthralgias, gait problem and joint swelling.  Skin: Negative for color change.     Physical Exam Updated Vital Signs BP (!) 153/105 (BP Location: Right Arm)  Pulse 84   Temp 98.6 F (37 C) (Oral)   Resp 16   Ht 5\' 10"  (1.778 m)   Wt 84.4 kg (186 lb)   SpO2 97%   BMI 26.69 kg/m   Physical Exam  Constitutional: He appears well-developed and well-nourished.  HENT:  Head: Normocephalic and atraumatic.  Right Ear: External ear normal.  Left Ear: External ear normal.  Eyes: Conjunctivae are normal. Right eye exhibits no discharge. Left eye exhibits no discharge. No scleral icterus.  Pulmonary/Chest: Effort normal. No respiratory distress.  Musculoskeletal:       Left knee: Normal.       Left ankle: He exhibits decreased range of motion (2/2 to pain) and swelling (lateral malleolus). He exhibits no ecchymosis, no  laceration and normal pulse. Tenderness. Lateral malleolus tenderness found. Achilles tendon normal. Achilles tendon exhibits no pain, no defect and normal Thompson's test results.       Right foot: There is normal range of motion, no tenderness, no bony tenderness and no swelling.  Compartments soft.   Neurological: He is alert.  Patient feels decreased sensation to light touch around the lateral malleolus and dorsal aspect of the ankle. Intact sensation to sharp dull along entire foot.   Skin: No pallor.  Psychiatric: He has a normal mood and affect.  Nursing note and vitals reviewed.    ED Treatments / Results  Labs (all labs ordered are listed, but only abnormal results are displayed) Labs Reviewed - No data to display  EKG  EKG Interpretation None       Radiology Dg Ankle Complete Left  Result Date: 11/06/2017 CLINICAL DATA:  Wrist Ing injury of the left ankle twice over the past 3 weeks with persistent ankle swelling. History of previous distal tibia and fibular fracture. EXAM: LEFT ANKLE COMPLETE - 3+ VIEW COMPARISON:  None in PACs FINDINGS: The bones are subjectively adequately mineralized. There is widening of the ankle joint mortise. The talar dome is intact. Bony densities lie inferior to the tip of the lateral malleolus. The medial malleolus exhibits no acute abnormality. The talus and calcaneus are intact. There are plantar and Achilles region calcaneal spurs. There is mild soft tissue swelling diffusely. IMPRESSION: Small bony densities inferior to the tip of the lateral malleolus are consistent with acute as well as old fracture fragments. There is disruption of the ankle joint mortise consistent with loss of integrity of the supporting ligamentous structures. Further evaluation with MRI may be useful. Electronically Signed   By: David  [place] M.D.   On: 11/06/2017 15:46    Procedures Procedures (including critical care time) SPLINT APPLICATION Date/Time: 5:10  PM Authorized by: Jacinto Halim Consent: Verbal consent obtained. Risks and benefits: risks, benefits and alternatives were discussed Consent given by: patient Splint applied by: orthopedic technician Location details: left ankle Splint type: posterior splint Supplies used: posterior splint Post-procedure: The splinted body part was neurovascularly unchanged following the procedure. Patient tolerance: Patient tolerated the procedure well with no immediate complications.    Medications Ordered in ED Medications - No data to display   Initial Impression / Assessment and Plan / ED Course  I have reviewed the triage vital signs and the nursing notes.  Pertinent labs & imaging results that were available during my care of the patient were reviewed by me and considered in my medical decision making (see chart for details).     36 y.o. male presenting to the emergency department today for 2 inversion injuries to left  ankle.  Patient had initial injury of left ankle by inversion approximately 2 weeks ago.  He was not seen for this and treated it with rest, elevation and nonweightbearing for several days.  His symptoms improved and he returned to work.  Approximately 3-4 days ago the patient reinjured the ankle with another inversion ankle injury.  Patient has been using a Velcro brace, crutches and ibuprofen for symptoms.  He has intact DP and PT pulses. Cap refill <2 seconds. No TTP of proximal tibia or fibula. He is intact to sharp/dull of the entire foot. Xray shows healing old tibia fracture as well as a small bony densities inferior to the tip of the lateral malleolus consistent with acute as well as old fractures.  There is disruption of the ankle joint mortise consistent with loss of integrity supporting ligamentous structures.  Consulted Dr. Ophelia CharterYates in regards to findings. Dr. Ophelia CharterYates advised the patient can be placed in a posterior splint and use crutches for non-weight bearing. He is to  follow up with his office in 1 week. No further imaging needed at this time. Pain treated in the department. Patient given cast/splint care. Return precautions discussed. He is to elevate the leg whenever not ambulating with crutches. He is to remain NWB until seen by ortho. Patient in agreement with plan and appears safe for discharge.   Final Clinical Impressions(s) / ED Diagnoses   Final diagnoses:  Closed fracture of left ankle, initial encounter    ED Discharge Orders        Ordered    traMADol (ULTRAM) 50 MG tablet  Every 6 hours PRN     11/06/17 1656       Princella PellegriniMaczis, Michael M, PA-C 11/06/17 1712    Linwood DibblesKnapp, Jon, MD 11/07/17 [phone]

## 2017-11-06 NOTE — Discharge Instructions (Signed)
Please read and follow all provided instructions.  You have been seen today for left ankle pain. You were found to have lateral malleolus (the outside portion of your ankle) fracture with likely a ligamentous injury as well.    You were placed in a posterior splint. Please use crutches to remain nonweightbearing until you are seen by orthopedics.  Follow cast and splint care handouts instructions.  For pain control you may take: 800mg  of ibuprofen (that is usually four 200mg  over the counter pills) up to 3 times a day (please take with food) and acetaminophen 975mg  (this is 3 normal strength, 325mg , over the counter pills) up to four times a day. Please do not take more than this. Do not drink alcohol or combine with other medications that have acetaminophen or Ibuprofen as an ingredient (Read the labels!).    For breakthrough pain you may take Ultram. Do not drink alcohol drive or operate heavy machinery when taking. You are being provided a prescription for opiates (also known as narcotics) for pain control on an ?as needed? basis.  Opiates can be addictive and should only be used when absolutely necessary for pain control when other alternatives do not work.  We recommend you only use them for the recommended amount of time and only as prescribed.  Please do not take with other sedative medications or alcohol.  Please do not drive, operate machinery, or make important decisions while taking opiates.  Please note that these medications can be addictive and have high abuse potential.  Please keep these medications locked away from children, teenagers or any family members with history of substance abuse. Additionally, these medications may cause constipation - take over the counter stool softeners or add fiber to your diet to treat this (Metamucil, Psyllium Fiber, Colace, Miralax) Further refills will need to be obtained from your primary care doctor and will not be prescribed through the Emergency  Department. You will test positive on most drug tests while taking this medication.    Follow-up instructions: Please follow-up orthopedic physician (bone specialist) in 1 week.   Return instructions:  Please return if your toes or feet are numb or tingling, appear gray or blue, or you have severe pain (also elevate the leg and loosen splint or wrap if you were given one) Please return to the Emergency Department if you experience worsening symptoms.  Please return if you have any other emergent concerns. Additional Information:  Your vital signs today were: BP (!) 141/98 (BP Location: Right Arm)    Pulse 75    Temp 98.6 F (37 C) (Oral)    Resp 16    Ht 5\' 10"  (1.778 m)    Wt 84.4 kg (186 lb)    SpO2 98%    BMI 26.69 kg/m  If your blood pressure (BP) was elevated above 135/85 this visit, please have this repeated by your doctor within one month. ---------------

## 2017-11-16 ENCOUNTER — Ambulatory Visit (INDEPENDENT_AMBULATORY_CARE_PROVIDER_SITE_OTHER): Payer: Self-pay | Admitting: Orthopaedic Surgery

## 2017-11-16 ENCOUNTER — Encounter (INDEPENDENT_AMBULATORY_CARE_PROVIDER_SITE_OTHER): Payer: Self-pay | Admitting: Orthopaedic Surgery

## 2017-11-16 VITALS — BP 145/97 | HR 93 | Ht 70.0 in | Wt 185.0 lb

## 2017-11-16 DIAGNOSIS — S93412A Sprain of calcaneofibular ligament of left ankle, initial encounter: Secondary | ICD-10-CM

## 2017-11-16 NOTE — Progress Notes (Addendum)
Office Visit Note   Patient: Carl DikeClinton E Merlin           Date of Birth: 1981-05-23           MRN: [id number] Visit Date: 11/16/2017              Requested by: No referring provider defined for this encounter. PCP: Patient, No Pcp Per   Assessment & Plan: Visit Diagnoses:  1. Sprain of calcaneofibular ligament of left ankle, initial encounter     Plan: Patient can work his way from the splint into the Swede-O uses crutches progressively work on weightbearing as tolerated continue ibuprofen elevation and ice.  As his pain decreases he can resume with instruction activity using his boot for support.  He can return as needed when he is ready to discuss tibial deformity.  He has had recurrent ankle sprains but he understands that with the tibial deformity that any ankle procedure is not likely to be successful due to lower extremity malalignment.  Follow-Up Instructions: Return if symptoms worsen or fail to improve.   Orders:  No orders of the defined types were placed in this encounter.  No orders of the defined types were placed in this encounter.     Procedures: No procedures performed   Clinical Data: No additional findings.   Subjective: Chief Complaint  Patient presents with  . Left Ankle - Fracture    HPI 36 year old male seen with left ankle injury.  Originally had ankle sprain around Thanksgiving.  He was back at work after that and then re-sprained it again.  X-rays demonstrated some ankle evulsion fragments laterally consistent with recurrent ankle sprains.  Patient has history of previous tibial fracture which was treated in [place] when he was around age 36 and healed in varus.  Patient is here today with his sister  Review of Systems is for history of depression.  Patient works in [employer].  He has been on crutches.  He has a Swede-O brace he recently purchased.  Positive for alcoholism.  Otherwise negative as it pertains HPI.   Objective: Vital Signs: BP  (!) 145/97   Pulse 93   Ht 5\' 10"  (1.778 m)   Wt 185 lb (83.9 kg)   BMI 26.54 kg/m   Physical Exam  Constitutional: He is oriented to person, place, and time. He appears well-developed and well-nourished.  HENT:  Head: Normocephalic and atraumatic.  Eyes: EOM are normal. Pupils are equal, round, and reactive to light.  Neck: No tracheal deviation present. No thyromegaly present.  Cardiovascular: Normal rate.  Pulmonary/Chest: Effort normal. He has no wheezes.  Abdominal: Soft. Bowel sounds are normal.  Neurological: He is alert and oriented to person, place, and time.  Skin: Skin is warm and dry. Capillary refill takes less than 2 seconds.  Psychiatric: He has a normal mood and affect. His behavior is normal. Judgment and thought content normal.    Ortho Exam patient is in a splint this was a closed injury.  He has a Swede-O at home he can work his way into.  Sensation of the toes is intact.  Varus deformity of the mid tibia noted.  Specialty Comments:  No specialty comments available.  Imaging: Previous ankle films showed the mid to distal third healed tibia fracture with about 17 degrees of varus.  We did not have designated complete tibial x-rays from knee to ankle for measurement.  Multiple loose bodies are noted laterally with some lateral talar tilt.  Widening of  the medial clear space consistent with recurrent ankle instability problems.   PMFS History: Patient Active Problem List   Diagnosis Date Noted  . Severe major depression without psychotic features (HCC) 04/22/2017  . Severe episode of recurrent major depressive disorder, with psychotic features (HCC)    No past medical history on file.  No family history on file.  No past surgical history on file. Social History   Occupational History  . Not on file  Tobacco Use  . Smoking status: Current Every Day Smoker    Packs/day: 1.50    Types: Cigarettes  . Smokeless tobacco: Never Used  Substance and Sexual  Activity  . Alcohol use: Yes    Comment: 6 beers per day  . Drug use: Yes    Types: Amphetamines, Marijuana  . Sexual activity: Not on file

## 2017-12-22 ENCOUNTER — Telehealth (INDEPENDENT_AMBULATORY_CARE_PROVIDER_SITE_OTHER): Payer: Self-pay | Admitting: Orthopaedic Surgery

## 2017-12-22 NOTE — Telephone Encounter (Signed)
Please see below.

## 2017-12-22 NOTE — Telephone Encounter (Signed)
[place] in and talk with her about application and eligibility.  I called he has already discussed.

## 2017-12-22 NOTE — Telephone Encounter (Signed)
Patient would like a call back from Dr. Ophelia CharterYates. Its concerning what he needs in the future to get his Medicaid,  His appointment with them is on 01/22 so he needs a call by then. # [phone]

## 2018-03-29 ENCOUNTER — Telehealth (INDEPENDENT_AMBULATORY_CARE_PROVIDER_SITE_OTHER): Payer: Self-pay | Admitting: Orthopaedic Surgery

## 2018-03-29 NOTE — Telephone Encounter (Signed)
Patient called saying that he now has medicaid and was wondering if he could go on ahead and schedule his surgery? Or What the next steps were? CB # [phone]

## 2018-03-29 NOTE — Telephone Encounter (Signed)
Please advise. Would you like for patient to make follow up appointment to discuss?

## 2018-03-29 NOTE — Telephone Encounter (Signed)
OK for ROV will need AP and lateral complete tibia for pre-op planning and come in for discussion.

## 2018-03-30 NOTE — Telephone Encounter (Signed)
Can you please call patient to schedule rov for his leg?  He can be worked in to schedule on Monday, 04/02/18 if he is available.

## 2018-04-02 ENCOUNTER — Ambulatory Visit (INDEPENDENT_AMBULATORY_CARE_PROVIDER_SITE_OTHER): Payer: Medicaid Other | Admitting: Orthopaedic Surgery

## 2018-04-02 ENCOUNTER — Ambulatory Visit (INDEPENDENT_AMBULATORY_CARE_PROVIDER_SITE_OTHER): Payer: Medicaid Other

## 2018-04-02 ENCOUNTER — Encounter (INDEPENDENT_AMBULATORY_CARE_PROVIDER_SITE_OTHER): Payer: Self-pay | Admitting: Orthopaedic Surgery

## 2018-04-02 VITALS — BP 144/93 | HR 77 | Ht 70.0 in | Wt 184.0 lb

## 2018-04-02 DIAGNOSIS — S93412A Sprain of calcaneofibular ligament of left ankle, initial encounter: Secondary | ICD-10-CM

## 2018-04-02 DIAGNOSIS — M722 Plantar fascial fibromatosis: Secondary | ICD-10-CM

## 2018-04-02 MED ORDER — LIDOCAINE HCL 1 % IJ SOLN
0.3300 mL | INTRAMUSCULAR | Status: AC | PRN
  Administered 2018-04-02: .33 mL

## 2018-04-02 MED ORDER — LIDOCAINE HCL (PF) 1 % IJ SOLN
1.0000 mL | INTRAMUSCULAR | Status: AC | PRN
  Administered 2018-04-02: 1 mL

## 2018-04-02 MED ORDER — METHYLPREDNISOLONE ACETATE 40 MG/ML IJ SUSP
40.0000 mg | INTRAMUSCULAR | Status: AC | PRN
  Administered 2018-04-02: 40 mg

## 2018-04-02 NOTE — Progress Notes (Signed)
Office Visit Note   Patient: Carl Mcdaniel           Date of Birth: 07/03/1981           MRN: [id number] Visit Date: 04/02/2018              Requested by: No referring provider defined for this encounter. PCP: Patient, No Pcp Per   Assessment & Plan: Visit Diagnoses:  1. Sprain of calcaneofibular ligament of left ankle, initial encounter   2. Plantar fasciitis of left foot     Plan: Plantar fascial injection performed.  His anterior ankle spurs will prevent him from doing significant heel cord stretching.  He has had recurrent ankle instability and tibia x-rays today document that he does not need to be osteotomy with the mild varus that is present.  He is a candidate for lateral ankle reconstruction spur removal.  We will check him back in 3 weeks and Visco heel inserts will be obtained at [facility] [employer].  Post plantar fashion injection patient could walk better with less pain but prior to the injection he would not put any weight on his heel.  Patient still has a gait summer walking on  eggshells with short stride gait.  I plan to recheck him in 3 weeks.  We discussed limited heel cord stretching use of frozen orange juice can or lemonade can to roll on the heel.  Plantar fasciitis pathophysiology discussed.  Follow-Up Instructions: Return in about 3 weeks (around 04/23/2018).   Orders:  Orders Placed This Encounter  Procedures  . Foot Inj  . XR Tibia/Fibula Left   No orders of the defined types were placed in this encounter.     Procedures: Foot Inj Date/Time: 04/02/2018 9:23 AM Performed by: Eldred Manges, MD Authorized by: Eldred Manges, MD   Indications:  Fasciitis Condition: Plantar Fasciitis   Location: left plantar fascia muscle   Needle Size:  25 G Medications:  0.33 mL lidocaine 1 %; 1 mL lidocaine (PF) 1 %; 40 mg methylPREDNISolone acetate 40 MG/ML     Clinical Data: No additional findings.   Subjective: Chief Complaint  Patient presents with  . Left  Leg - Pain    HPI 37 year old male returns with ongoing problems with left foot and ankle pain.  He has had multiple severe ankle sprains over the years.  Previous tibial fracture at age 22 healed in 5 degrees of varus.  He has had recurrent ankle sprains requiring a cam boot immobilization cast and currently is wearing a Swede-O.  Recently has had increased pain in his heel states he cannot put his heel down it is very painful he is amatory with a cane.  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  He points directly over the calcaneal origin of the plantar fascia where he is having pain.  Review of Systems 14 point review of systems updated unchanged from last office visit other than as mentioned in HPI.   Objective: Vital Signs: BP (!) 144/93   Pulse 77   Ht  (1.778 m)   Wt 184 lb (83.5 kg)   BMI 26.40 kg/m   Physical Exam  Constitutional: He is oriented to person, place, and time. He appears well-developed and well-nourished.  HENT:  Head: Normocephalic and atraumatic.  Eyes: Pupils are equal, round, and reactive to light. EOM are normal.  Neck: No tracheal deviation present. No thyromegaly present.  Cardiovascular: Normal rate.  Pulmonary/Chest: Effort normal. He has no wheezes.  Abdominal: Soft. Bowel sounds are normal.  Neurological: He is alert and oriented to person, place, and time.  Skin: Skin is warm and dry. Capillary refill takes less than 2 seconds.  Psychiatric: He has a normal mood and affect. His behavior is normal. Judgment and thought content normal.    Ortho Exam patient has intact distal pulses negative straight leg raising negative popliteal compression test.  Exquisite tenderness over his left heel at the plantar fascial origin.  No peroneal subluxation.  There is some pain with ankle dorsiflexion which is limited to only 10 degrees.  Palpable anterior spurs distal tibia and talar neck  which are tender.  Some tenderness over the anterior talofibular positive anterior drawer test. Specialty Comments:  No specialty comments available.  Imaging: Xr Tibia/fibula Left  Result Date: 04/02/2018 AP and lateral left tibia x-rays obtained and reviewed.  This shows healed distal third tibia fracture with 5 degrees posterior apex angulation and 5 degrees of varus.  Fracture is healed fibula is intact.  Anterior tibia and talar neck spurs are noted. Impression: Healed tibia fracture.  Anterior ankle spurs.    PMFS History: Patient Active Problem List   Diagnosis Date Noted  . Severe major depression without psychotic features (HCC) 04/22/2017  . Severe episode of recurrent major depressive disorder, with psychotic features (HCC)    No past medical history on file.  No family history on file.  No past surgical history on file. Social History   Occupational History  . Not on file  Tobacco Use  . Smoking status: Current Every Day Smoker    Packs/day: 1.50    Types: Cigarettes  . Smokeless tobacco: Never Used  Substance and Sexual Activity  . Alcohol use: Yes    Comment: 6 beers per day  . Drug use: Yes    Types: Amphetamines, Marijuana  . Sexual activity: Not on file

## 2018-04-24 ENCOUNTER — Ambulatory Visit (INDEPENDENT_AMBULATORY_CARE_PROVIDER_SITE_OTHER): Payer: Medicaid Other | Admitting: Orthopaedic Surgery

## 2018-04-24 ENCOUNTER — Encounter (INDEPENDENT_AMBULATORY_CARE_PROVIDER_SITE_OTHER): Payer: Self-pay | Admitting: Orthopaedic Surgery

## 2018-04-24 VITALS — BP 140/91 | HR 84 | Ht 70.0 in | Wt 177.0 lb

## 2018-04-24 DIAGNOSIS — S93412A Sprain of calcaneofibular ligament of left ankle, initial encounter: Secondary | ICD-10-CM | POA: Diagnosis not present

## 2018-04-24 NOTE — Progress Notes (Signed)
Office Visit Note   Patient: Carl Mcdaniel           Date of Birth: 07-18-1981           MRN: [id number] Visit Date: 04/24/2018              Requested by: No referring provider defined for this encounter. PCP: Patient, No Pcp Per   Assessment & Plan: Visit Diagnoses:  1. Sprain of calcaneofibular ligament of left ankle, initial encounter     Plan: We will proceed with MRI scan of his left ankle for his anterior spur impingement with recurrent ankle sprains.  Fascia is walking more comfortable.  A brace on his ankle.  Multiple times when he works.  Office follow-up after MRI of his ankle.  Follow-Up Instructions: No follow-ups on file.   Orders:  Orders Placed This Encounter  Procedures  . MR Ankle Left w/o contrast   No orders of the defined types were placed in this encounter.     Procedures: No procedures performed   Clinical Data: No additional findings.   Subjective: Chief Complaint  Patient presents with  . Left Ankle - Pain, Follow-up  . Lower Back - Pain, Follow-up  . Left Foot - Pain, Follow-up    HPI 37 year old male returns with ongoing problems with fasciitis.  He states plantar fashion injection is given him quite a bit of relief.  Discussed the previous tibial fracture which is healed along x-rays on last visit demonstrated he does not need a tibial osteotomy to correct his alignment so although there is some slight displacement fracture overall alignment looks good.  Continues to have problems with recurrent ankle sprains.  Previous x-ray showed anterior spurs related to his previous multiple ankle injuries.  Opposite ankle is given he is walking better after the heel injection.  Review of Systems 4/22,019 office visit.   Objective: Vital Signs: BP (!) 140/91   Pulse 84   Ht  (1.778 m)   Wt 177 lb (80.3 kg)   BMI 25.40 kg/m   Physical Exam  Constitutional: He is oriented to person, place, and time. He appears well-developed and  well-nourished.  HENT:  Head: Normocephalic and atraumatic.  Eyes: Pupils are equal, round, and reactive to light. EOM are normal.  Neck: No tracheal deviation present. No thyromegaly present.  Cardiovascular: Normal rate.  Pulmonary/Chest: Effort normal. He has no wheezes.  Abdominal: Soft. Bowel sounds are normal.  Neurological: He is alert and oriented to person, place, and time.  Skin: Skin is warm and dry. Capillary refill takes less than 2 seconds.  Psychiatric: He has a normal mood and affect. His behavior is normal. Judgment and thought content normal.    Ortho Exam bilateral 1+ anterior drawer both ankles.  Flexion of his ankle palpable anterior spur over the talar neck.  Peroneals anterior to posterior tib or ankle active.  Specialty Comments:  No specialty comments available.  Imaging: No results found.   PMFS History: Patient Active Problem List   Diagnosis Date Noted  . Severe major depression without psychotic features (HCC) 04/22/2017  . Severe episode of recurrent major depressive disorder, with psychotic features (HCC)    No past medical history on file.  No family history on file.  No past surgical history on file. Social History   Occupational History  . Not on file  Tobacco Use  . Smoking status: Current Every Day Smoker    Packs/day: 1.50    Types: Cigarettes  .  Smokeless tobacco: Never Used  Substance and Sexual Activity  . Alcohol use: Yes    Comment: 6 beers per day  . Drug use: Yes    Types: Amphetamines, Marijuana  . Sexual activity: Not on file

## 2019-11-11 ENCOUNTER — Emergency Department (HOSPITAL_COMMUNITY)
Admission: EM | Admit: 2019-11-11 | Discharge: 2019-11-12 | Disposition: A | Discharge status: Other Acute Inpatient Hospital | Payer: Medicaid Other | Attending: Emergency Medicine | Admitting: Emergency Medicine

## 2019-11-11 ENCOUNTER — Encounter (HOSPITAL_COMMUNITY): Payer: Self-pay | Admitting: Emergency Medicine

## 2019-11-11 ENCOUNTER — Other Ambulatory Visit: Payer: Self-pay

## 2019-11-11 DIAGNOSIS — R45851 Suicidal ideations: Secondary | ICD-10-CM | POA: Insufficient documentation

## 2019-11-11 DIAGNOSIS — F1020 Alcohol dependence, uncomplicated: Secondary | ICD-10-CM | POA: Insufficient documentation

## 2019-11-11 DIAGNOSIS — F17210 Nicotine dependence, cigarettes, uncomplicated: Secondary | ICD-10-CM | POA: Insufficient documentation

## 2019-11-11 DIAGNOSIS — Z20828 Contact with and (suspected) exposure to other viral communicable diseases: Secondary | ICD-10-CM | POA: Diagnosis not present

## 2019-11-11 DIAGNOSIS — F323 Major depressive disorder, single episode, severe with psychotic features: Secondary | ICD-10-CM | POA: Diagnosis present

## 2019-11-11 LAB — ACETAMINOPHEN LEVEL: Acetaminophen (Tylenol), Serum: <10 ug/mL — ABNORMAL LOW (ref 10–30)

## 2019-11-11 LAB — COMPREHENSIVE METABOLIC PANEL
ALT: 43 U/L (ref 0–44)
AST: 33 U/L (ref 15–41)
Albumin: 4.1 g/dL (ref 3.5–5.0)
Alkaline Phosphatase: 61 U/L (ref 38–126)
Anion gap: 13 (ref 5–15)
BUN: 6 mg/dL (ref 6–20)
CO2: 22 mmol/L (ref 22–32)
Calcium: 9.7 mg/dL (ref 8.9–10.3)
Chloride: 104 mmol/L (ref 98–111)
Creatinine, Ser: 0.96 mg/dL (ref 0.61–1.24)
GFR calc Af Amer: >60 mL/min (ref >60)
GFR calc non Af Amer: >60 mL/min (ref >60)
Glucose, Bld: 100 mg/dL — ABNORMAL HIGH (ref 70–99)
Potassium: 3.6 mmol/L (ref 3.5–5.1)
Sodium: 139 mmol/L (ref 135–145)
Total Bilirubin: 0.9 mg/dL (ref 0.3–1.2)
Total Protein: 7.6 g/dL (ref 6.5–8.1)

## 2019-11-11 LAB — ETHANOL: Alcohol, Ethyl (B): <10 mg/dL (ref <10)

## 2019-11-11 LAB — SALICYLATE LEVEL: Salicylate Lvl: <7 mg/dL (ref 2.8–30.0)

## 2019-11-11 LAB — CBC
HCT: 46.1 % (ref 39.0–52.0)
Hemoglobin: 15.6 g/dL (ref 13.0–17.0)
MCH: 28.4 pg (ref 26.0–34.0)
MCHC: 33.8 g/dL (ref 30.0–36.0)
MCV: 84 fL (ref 80.0–100.0)
Platelets: 198 10*3/uL (ref 150–400)
RBC: 5.49 MIL/uL (ref 4.22–5.81)
RDW: 13.8 % (ref 11.5–15.5)
WBC: 11.9 10*3/uL — ABNORMAL HIGH (ref 4.0–10.5)
nRBC: 0 % (ref 0.0–0.2)

## 2019-11-11 NOTE — ED Triage Notes (Addendum)
Patient here with suicidal thoughts, does not have a specific plan at this time.  Patient states that he came close yesterday to hurting himself.  He states that he has been hearing voices that are getting louder and louder.  Patient does drink alcohol. Had some pot today.

## 2019-11-12 ENCOUNTER — Inpatient Hospital Stay (HOSPITAL_COMMUNITY)
Admission: AD | Admit: 2019-11-12 | Discharge: 2019-11-15 | DRG: 885 | Disposition: A | Discharge status: Home / Self Care | Payer: Medicaid Other | Source: Intra-hospital | Attending: Psychiatry | Admitting: Psychiatry

## 2019-11-12 ENCOUNTER — Other Ambulatory Visit: Payer: Self-pay

## 2019-11-12 ENCOUNTER — Encounter (HOSPITAL_COMMUNITY): Payer: Self-pay

## 2019-11-12 DIAGNOSIS — Z590 Homelessness: Secondary | ICD-10-CM

## 2019-11-12 DIAGNOSIS — Z62819 Personal history of unspecified abuse in childhood: Secondary | ICD-10-CM | POA: Diagnosis present

## 2019-11-12 DIAGNOSIS — F333 Major depressive disorder, recurrent, severe with psychotic symptoms: Principal | ICD-10-CM | POA: Diagnosis present

## 2019-11-12 DIAGNOSIS — Z23 Encounter for immunization: Secondary | ICD-10-CM

## 2019-11-12 DIAGNOSIS — F10232 Alcohol dependence with withdrawal with perceptual disturbance: Secondary | ICD-10-CM | POA: Diagnosis present

## 2019-11-12 DIAGNOSIS — Z915 Personal history of self-harm: Secondary | ICD-10-CM

## 2019-11-12 DIAGNOSIS — F17210 Nicotine dependence, cigarettes, uncomplicated: Secondary | ICD-10-CM | POA: Diagnosis present

## 2019-11-12 DIAGNOSIS — Z91013 Allergy to seafood: Secondary | ICD-10-CM | POA: Diagnosis not present

## 2019-11-12 DIAGNOSIS — F10932 Alcohol use, unspecified with withdrawal with perceptual disturbance: Secondary | ICD-10-CM

## 2019-11-12 LAB — SARS CORONAVIRUS 2 (TAT 6-24 HRS): SARS Coronavirus 2: NEGATIVE

## 2019-11-12 LAB — RAPID URINE DRUG SCREEN, HOSP PERFORMED
Amphetamines: NOT DETECTED
Barbiturates: NOT DETECTED
Benzodiazepines: NOT DETECTED
Cocaine: NOT DETECTED
Opiates: NOT DETECTED
Tetrahydrocannabinol: POSITIVE — AB

## 2019-11-12 LAB — RESPIRATORY PANEL BY RT PCR (FLU A&B, COVID)
Influenza A by PCR: NEGATIVE
Influenza B by PCR: NEGATIVE
SARS Coronavirus 2 by RT PCR: NEGATIVE

## 2019-11-12 MED ORDER — CHLORDIAZEPOXIDE HCL 25 MG PO CAPS: 25.0000 mg | ORAL_CAPSULE | Freq: Three times a day (TID) | ORAL | Status: DC

## 2019-11-12 MED ORDER — MAGNESIUM HYDROXIDE 400 MG/5ML PO SUSP: 30.0000 mL | Freq: Every day | ORAL | Status: DC | PRN

## 2019-11-12 MED ORDER — VITAMIN B-1 100 MG PO TABS
100.0000 mg | ORAL_TABLET | Freq: Every day | ORAL | Status: DC
  Administered 2019-11-13 – 2019-11-15 (×3): 100 mg via ORAL
  Filled 2019-11-12 (×6): qty 1

## 2019-11-12 MED ORDER — LOPERAMIDE HCL 2 MG PO CAPS
2.0000 mg | ORAL_CAPSULE | ORAL | Status: AC | PRN
  Administered 2019-11-13: 2 mg via ORAL
  Filled 2019-11-12: qty 1
  Filled 2019-11-12: qty 2

## 2019-11-12 MED ORDER — SERTRALINE HCL 100 MG PO TABS
100.0000 mg | ORAL_TABLET | Freq: Every day | ORAL | Status: DC
  Administered 2019-11-12 – 2019-11-15 (×4): 100 mg via ORAL
  Filled 2019-11-12 (×5): qty 1
  Filled 2019-11-12: qty 2
  Filled 2019-11-12: qty 1

## 2019-11-12 MED ORDER — ACETAMINOPHEN 325 MG PO TABS: 650.0000 mg | ORAL_TABLET | Freq: Four times a day (QID) | ORAL | Status: DC | PRN

## 2019-11-12 MED ORDER — ALUM & MAG HYDROXIDE-SIMETH 200-200-20 MG/5ML PO SUSP: 30.0000 mL | ORAL | Status: DC | PRN

## 2019-11-12 MED ORDER — CHLORDIAZEPOXIDE HCL 25 MG PO CAPS
25.0000 mg | ORAL_CAPSULE | Freq: Three times a day (TID) | ORAL | Status: AC
  Administered 2019-11-13 (×3): 25 mg via ORAL
  Filled 2019-11-12 (×2): qty 1

## 2019-11-12 MED ORDER — NICOTINE 21 MG/24HR TD PT24
21.0000 mg | MEDICATED_PATCH | Freq: Every day | TRANSDERMAL | Status: DC
  Administered 2019-11-12 – 2019-11-15 (×4): 21 mg via TRANSDERMAL
  Filled 2019-11-12 (×6): qty 1

## 2019-11-12 MED ORDER — CHLORDIAZEPOXIDE HCL 25 MG PO CAPS
25.0000 mg | ORAL_CAPSULE | Freq: Every day | ORAL | Status: AC
  Administered 2019-11-15: 25 mg via ORAL
  Filled 2019-11-12: qty 1

## 2019-11-12 MED ORDER — CHLORDIAZEPOXIDE HCL 25 MG PO CAPS
25.0000 mg | ORAL_CAPSULE | ORAL | Status: AC
  Administered 2019-11-14 (×2): 25 mg via ORAL
  Filled 2019-11-12 (×2): qty 1

## 2019-11-12 MED ORDER — ADULT MULTIVITAMIN W/MINERALS CH
1.0000 | ORAL_TABLET | Freq: Every day | ORAL | Status: DC
  Administered 2019-11-12 – 2019-11-15 (×4): 1 via ORAL
  Filled 2019-11-12 (×7): qty 1

## 2019-11-12 MED ORDER — CHLORDIAZEPOXIDE HCL 25 MG PO CAPS
25.0000 mg | ORAL_CAPSULE | Freq: Four times a day (QID) | ORAL | Status: AC | PRN
  Administered 2019-11-12: 25 mg via ORAL
  Filled 2019-11-12: qty 1

## 2019-11-12 MED ORDER — CHLORDIAZEPOXIDE HCL 25 MG PO CAPS: 25.0000 mg | ORAL_CAPSULE | Freq: Every day | ORAL | Status: DC

## 2019-11-12 MED ORDER — ONDANSETRON 4 MG PO TBDP: 4.0000 mg | ORAL_TABLET | Freq: Four times a day (QID) | ORAL | Status: AC | PRN

## 2019-11-12 MED ORDER — CHLORDIAZEPOXIDE HCL 25 MG PO CAPS: 50.0000 mg | ORAL_CAPSULE | Freq: Once | ORAL | Status: DC

## 2019-11-12 MED ORDER — CHLORDIAZEPOXIDE HCL 25 MG PO CAPS: 25.0000 mg | ORAL_CAPSULE | ORAL | Status: DC

## 2019-11-12 MED ORDER — HYDROXYZINE HCL 25 MG PO TABS
25.0000 mg | ORAL_TABLET | Freq: Three times a day (TID) | ORAL | Status: DC | PRN
  Administered 2019-11-14: 25 mg via ORAL
  Filled 2019-11-12: qty 1

## 2019-11-12 MED ORDER — INFLUENZA VAC SPLIT QUAD 0.5 ML IM SUSY
0.5000 mL | PREFILLED_SYRINGE | INTRAMUSCULAR | Status: AC
  Administered 2019-11-13: 0.5 mL via INTRAMUSCULAR
  Filled 2019-11-12: qty 0.5

## 2019-11-12 MED ORDER — CHLORDIAZEPOXIDE HCL 25 MG PO CAPS
25.0000 mg | ORAL_CAPSULE | Freq: Four times a day (QID) | ORAL | Status: AC
  Administered 2019-11-12 (×2): 25 mg via ORAL
  Filled 2019-11-12 (×2): qty 1

## 2019-11-12 MED ORDER — CHLORDIAZEPOXIDE HCL 25 MG PO CAPS: 25.0000 mg | ORAL_CAPSULE | Freq: Four times a day (QID) | ORAL | Status: DC

## 2019-11-12 MED ORDER — HYDROXYZINE HCL 25 MG PO TABS
25.0000 mg | ORAL_TABLET | Freq: Four times a day (QID) | ORAL | Status: AC | PRN
  Administered 2019-11-13 – 2019-11-14 (×3): 25 mg via ORAL
  Filled 2019-11-12 (×3): qty 1

## 2019-11-12 MED ORDER — THIAMINE HCL 100 MG/ML IJ SOLN
100.0000 mg | Freq: Once | INTRAMUSCULAR | Status: AC
  Administered 2019-11-12: 100 mg via INTRAMUSCULAR
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
## 2019-11-12 NOTE — Progress Notes (Signed)
CSW following for disposition needs

## 2019-11-12 NOTE — ED Provider Notes (Signed)
[facility] EMERGENCY DEPARTMENT Provider Note   CSN: [id number] Arrival date & time: 11/11/19  1930     History   Chief Complaint Chief Complaint  Patient presents with  . Suicidal    HPI Liston E Gruenberg is a 38 y.o. male.     Patient presents to the emergency department with a chief complaint of suicidal thoughts.  He states that he has been depressed for quite some time.  He states that occasionally he has suicidal thoughts.  States that they have been stronger in the past few days.  He states that yesterday he almost slashed his wrists, but he "pulled a knife away."  He states that he has been seen in the past for the same, but was never 100% honest with how severe his symptoms were.  He would like to talk to someone tonight.  He also reports increasing problems with hearing voices.  He reports alcohol use, and occasional drug use.  Denies any recent drug use.  Denies any recent illnesses.  The history is provided by the patient. No language interpreter was used.    History reviewed. No pertinent past medical history.  Patient Active Problem List   Diagnosis Date Noted  . Severe major depression without psychotic features (HCC) 04/22/2017  . Severe episode of recurrent major depressive disorder, with psychotic features (HCC)     History reviewed. No pertinent surgical history.      Home Medications    Prior to Admission medications   Medication Sig Start Date End Date Taking? Authorizing Provider  ibuprofen (ADVIL) 200 MG tablet Take 200-400 mg by mouth every 6 (six) hours as needed (for lower back pain).   Yes [provider]  ARIPiprazole (ABILIFY) 20 MG tablet Take 1 tablet (20 mg total) by mouth daily. Patient not taking: Reported on 11/12/2019 04/29/17   Adonis Brook, NP  nicotine (NICODERM CQ - DOSED IN MG/24 HOURS) 21 mg/24hr patch Place 1 patch (21 mg total) onto the skin daily. Patient not taking: Reported on 11/12/2019 04/29/17    Adonis Brook, NP  sertraline (ZOLOFT) 50 MG tablet Take 1 tablet (50 mg total) by mouth daily. Patient not taking: Reported on 11/12/2019 04/29/17   Adonis Brook, NP  traMADol (ULTRAM) 50 MG tablet Take 1 tablet (50 mg total) by mouth every 6 (six) hours as needed. Patient not taking: Reported on 11/12/2019 11/06/17   Maczis, Elmer Sow, PA-C  traZODone (DESYREL) 100 MG tablet Take 1 tablet (100 mg total) by mouth at bedtime as needed for sleep. Patient not taking: Reported on 11/12/2019 04/28/17   Adonis Brook, NP    Family History History reviewed. No pertinent family history.  Social History Social History   Tobacco Use  . Smoking status: Current Every Day Smoker    Packs/day: 1.50    Types: Cigarettes  . Smokeless tobacco: Never Used  Substance Use Topics  . Alcohol use: Yes    Comment: 6 beers per day  . Drug use: Yes    Types: Amphetamines, Marijuana     Allergies   Shellfish-derived products   Review of Systems Review of Systems  All other systems reviewed and are negative.    Physical Exam Updated Vital Signs BP (!) 147/101 (BP Location: Right Arm)   Pulse (!) 109   Temp 98.8 F (37.1 C) (Oral)   Resp 18   SpO2 97%   Physical Exam Vitals signs and nursing note reviewed.  Constitutional:  General: He is not in acute distress.    Appearance: He is well-developed. He is not ill-appearing.  HENT:     Head: Normocephalic and atraumatic.  Eyes:     Conjunctiva/sclera: Conjunctivae normal.  Neck:     Musculoskeletal: Neck supple.  Cardiovascular:     Rate and Rhythm: Normal rate.  Pulmonary:     Effort: Pulmonary effort is normal. No respiratory distress.  Abdominal:     General: There is no distension.  Musculoskeletal:     Comments: Moves all extremities  Skin:    General: Skin is warm and dry.  Neurological:     Mental Status: He is alert and oriented to person, place, and time.  Psychiatric:        Mood and Affect: Mood normal.         Behavior: Behavior normal.      ED Treatments / Results  Labs (all labs ordered are listed, but only abnormal results are displayed) Labs Reviewed  COMPREHENSIVE METABOLIC PANEL - Abnormal; Notable for the following components:      Result Value   Glucose, Bld 100 (*)    All other components within normal limits  ACETAMINOPHEN LEVEL - Abnormal; Notable for the following components:   Acetaminophen (Tylenol), Serum <10 (*)    All other components within normal limits  CBC - Abnormal; Notable for the following components:   WBC 11.9 (*)    All other components within normal limits  ETHANOL  SALICYLATE LEVEL  RAPID URINE DRUG SCREEN, HOSP PERFORMED    EKG None  Radiology No results found.  Procedures Procedures (including critical care time)  Medications Ordered in ED Medications - No data to display   Initial Impression / Assessment and Plan / ED Course  I have reviewed the triage vital signs and the nursing notes.  Pertinent labs & imaging results that were available during my care of the patient were reviewed by me and considered in my medical decision making (see chart for details).        Patient with suicidal thoughts and depression.  Patient is medically clear for TTS evaluation.  Patient recommended for inpatient treatment per psychiatry.  Waiting on COVID.  Has a bed at [facility].  COVID test swapped to cepheid by charge RN.  Final Clinical Impressions(s) / ED Diagnoses   Final diagnoses:  Suicidal thoughts    ED Discharge Orders    None       Montine Circle, PA-C 11/12/19 1610    Veryl Speak, MD 11/12/19 [phone]

## 2019-11-12 NOTE — ED Notes (Signed)
Breakfast ordered 

## 2019-11-12 NOTE — ED Notes (Signed)
Informed by PA that pt has a bed at [facility] after covid test comes back negative.

## 2019-11-12 NOTE — [facility] Suicide Risk Assessment (Signed)
[facility] Admission Suicide Risk Assessment  Total Time spent with patient: 45 minutes Principal Problem: Suicidal thoughts in the context of alcohol dependency, recurrent depression, cannabis abuse/imminent alcohol withdrawal Diagnosis:  Active Problems:   Severe recurrent major depression with psychotic features (HCC)   Alcohol withdrawal, with perceptual disturbance (HCC)  Subjective Data: Severe depression alcohol dependence  Continued Clinical Symptoms:    The "Alcohol Use Disorders Identification Test", Guidelines for Use in Primary Care, Second Edition.  World Pharmacologist [facility]). Score between 0-7:  no or low risk or alcohol related problems. Score between 8-15:  moderate risk of alcohol related problems. Score between 16-19:  high risk of alcohol related problems. Score 20 or above:  warrants further diagnostic evaluation for alcohol dependence and treatment.   CLINICAL FACTORS:   Depression:   Anhedonia Dysthymia Musculoskeletal: Strength & Muscle Tone: within normal limits Gait & Station: normal Patient leans: N/A  Psychiatric Specialty Exam: Physical Exam  ROS  Blood pressure (!) 140/93, pulse 70, temperature 98.2 F (36.8 C), temperature source Oral, resp. rate 18, height 5' 7.5" (1.715 m), weight 76.2 kg, SpO2 100 %.Body mass index is 25.92 kg/m.  General Appearance: Casual  Eye Contact:  Fair  Speech:  Clear and Coherent  Volume:  Decreased  Mood:  Dysphoric  Affect:  Blunt  Thought Process:  Coherent, Goal Directed and Descriptions of Associations: Circumstantial  Orientation:  Full (Time, Place, and Person)  Thought Content:  Voices are generally intrusive thoughts rather than true auditory hallucinations at this point understands what it is to contract for safety  Suicidal Thoughts:  Yes.  without intent/plan  Homicidal Thoughts:  No  Memory:  Immediate;   Fair Recent;   Fair Remote;   Fair  Judgement:  Fair  Insight:  Good  Psychomotor Activity:   Normal  Concentration:  Concentration: Fair and Attention Span: Fair  Recall:  [employer] of Knowledge:  Fair  Language:  Good  Akathisia:  Negative  Handed:  Right  AIMS (if indicated):     Assets:  Desire for Improvement Financial Resources/Insurance  ADL's:  Intact  Cognition:  WNL  Sleep:           COGNITIVE FEATURES THAT CONTRIBUTE TO RISK:  Polarized thinking    SUICIDE RISK:   Moderate:  Frequent suicidal ideation with limited intensity, and duration, some specificity in terms of plans, no associated intent, good self-control, limited dysphoria/symptomatology, some risk factors present, and identifiable protective factors, including available and accessible social support.  PLAN OF CARE: Admit for detox and stabilization  I certify that inpatient services furnished can reasonably be expected to improve the patient's condition.   Johnn Hai, MD 11/12/2019, 1:25 PM

## 2019-11-12 NOTE — Progress Notes (Signed)
Psychoeducational Group Note  Date:  11/12/2019 Time:  2024  Group Topic/Focus:  Wrap-Up Group:   The focus of this group is to help patients review their daily goal of treatment and discuss progress on daily workbooks.  Participation Level: Did Not Attend  Participation Quality:  Not Applicable  Affect:  Not Applicable  Cognitive:  Not Applicable  Insight:  Not Applicable  Engagement in Group: Not Applicable  Additional Comments:  The patient did not attend group this evening.   Archie Balboa S 11/12/2019, 8:24 PM

## 2019-11-12 NOTE — ED Notes (Signed)
Diet was ordered for Lunch. 

## 2019-11-12 NOTE — ED Notes (Signed)
[facility] called and said that the wrong covid was sent and they cannot do the rapid on the black tube that was sent.

## 2019-11-12 NOTE — ED Notes (Addendum)
[facility] transporting pt to [place] belongings - 1 labeled belongings bag - [facility] - Pt aware. Pt declined encouragement to contact family member/friend.
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
## 2019-11-12 NOTE — ED Notes (Signed)
belonings placed in locker #6

## 2019-11-12 NOTE — ED Notes (Signed)
Correct tube for rapid covid-2 hour-grey top/pink fluid obtained and sample from patient. Walked to lab.

## 2019-11-12 NOTE — Progress Notes (Signed)
   11/12/19 2200  Psych Admission Type (Psych Patients Only)  Admission Status Voluntary  Psychosocial Assessment  Patient Complaints None  Eye Contact Brief  Facial Expression Anxious  Affect Depressed;Sad  Speech Logical/coherent  Interaction Minimal  Thought Process  Coherency WDL  Content WDL  Delusions WDL;None reported or observed  Perception WDL  Hallucination None reported or observed  Judgment WDL  Confusion WDL  Danger to Self  Current suicidal ideation? Passive  Self-Injurious Behavior Some self-injurious ideation observed or expressed.  No lethal plan expressed   Agreement Not to Harm Self Yes  Description of Agreement verbal contract for safety

## 2019-11-12 NOTE — Progress Notes (Signed)
Patient ID: Carl Mcdaniel, male   DOB: 08-27-1981, 38 y.o.   MRN: [id number]   Skin assessment complete.  Patient found to be free of contraband and injury.  Patient currently endorses suicidal ideations with a plan to lay in bed and slit his wrists.  Patient reports daily drinking of at least a 12 pack of beer, THC usage three times a week and occasional cocaine use.  Patient was noted to have a flat affect.  Patient admitted to the unit without incident.  Patient is to be monitored per unit protocol.

## 2019-11-12 NOTE — ED Notes (Signed)
Patient denies pain and is resting comfortably.  

## 2019-11-13 LAB — LIPID PANEL
Cholesterol: 190 mg/dL (ref 0–200)
HDL: 47 mg/dL (ref >40)
LDL Cholesterol: 107 mg/dL — ABNORMAL HIGH (ref 0–99)
Total CHOL/HDL Ratio: 4 RATIO
Triglycerides: 178 mg/dL — ABNORMAL HIGH (ref <150)
VLDL: 36 mg/dL (ref 0–40)

## 2019-11-13 LAB — TSH: TSH: 3.678 u[IU]/mL (ref 0.350–4.500)

## 2019-11-13 LAB — HEMOGLOBIN A1C
Hgb A1c MFr Bld: 6.3 % — ABNORMAL HIGH (ref 4.8–5.6)
Mean Plasma Glucose: 134.11 mg/dL

## 2019-11-13 MED ORDER — CARVEDILOL 25 MG PO TABS
25.0000 mg | ORAL_TABLET | Freq: Two times a day (BID) | ORAL | Status: DC
  Administered 2019-11-13 – 2019-11-15 (×5): 25 mg via ORAL
  Filled 2019-11-13 (×9): qty 1

## 2019-11-13 MED ORDER — TRAZODONE HCL 50 MG PO TABS
50.0000 mg | ORAL_TABLET | Freq: Every evening | ORAL | Status: DC | PRN
  Administered 2019-11-13 – 2019-11-14 (×2): 50 mg via ORAL
  Filled 2019-11-13 (×2): qty 1

## 2019-11-13 NOTE — Progress Notes (Signed)
Recreation Therapy Notes  INPATIENT RECREATION THERAPY ASSESSMENT  Patient Details Name: Carl Mcdaniel MRN: [id number] DOB: June 07, 1981 Today's Date: 11/13/2019       Information Obtained From: Patient  Able to Participate in Assessment/Interview: Yes  Patient Presentation: (Flat, Depressed)  Reason for Admission (Per Patient): Suicidal Ideation, Other (Comments)(Pt stated everything was piling up on him.)  Patient Stressors: Other (Comment)(Voices; Everything)  Coping Skills:   Isolation, Self-Injury, TV, Sports, Aggression, Music, Meditate, Deep Breathing, Substance Abuse, Talk, Prayer, Avoidance  Leisure Interests (2+):  Games - Video games, Music - Listen  Frequency of Recreation/Participation: Weekly  Awareness of Community Resources:  Yes  Community Resources:  [place]  Current Use: No  If no, Barriers?: Other (Comment)(COVID)  Expressed Interest in [employer] Information: No  [employer] of Residence:  Lobbyist  Patient Main Form of Transportation: Musician  Patient Strengths:  Hear things others can't; See things in dreams before they come true  Patient Identified Areas of Improvement:  Will power  Patient Goal for Hospitalization:  "get to a better place in my mind then I am right now because right now I don't know if I'll make it to Christmas"  Current SI (including self-harm):  No  Current HI:  No  Current AVH: No  Staff Intervention Plan: Group Attendance, Collaborate with Interdisciplinary Treatment Team  Consent to Intern Participation: N/A    Victorino Sparrow, LRT/CTRS  Ria Comment, Lesbia Ottaway A 11/13/2019, 1:04 PM

## 2019-11-13 NOTE — Progress Notes (Signed)
   11/13/19 2200  Psych Admission Type (Psych Patients Only)  Admission Status Voluntary  Psychosocial Assessment  Patient Complaints None  Eye Contact Brief  Facial Expression Anxious  Affect Depressed;Sad  Speech Logical/coherent  Interaction Minimal  Motor Activity Slow  Appearance/Hygiene Disheveled  Behavior Characteristics Cooperative  Mood Depressed;Pleasant  Thought Process  Coherency WDL  Content WDL  Delusions WDL;None reported or observed  Perception WDL  Hallucination None reported or observed  Judgment WDL  Confusion WDL  Danger to Self  Current suicidal ideation? Passive  Self-Injurious Behavior Some self-injurious ideation observed or expressed.  No lethal plan expressed   Agreement Not to Harm Self Yes  Description of Agreement verbal contract for safety

## 2019-11-13 NOTE — Progress Notes (Signed)
[facility] MD Progress Note  11/13/2019 10:02 AM BATES COLLINGTON  MRN:  [id number] Subjective:   Pt's detox progressing however, his BP remains up despite adequate regimen so we will add beta blocker No si  Contracts here No cravings Still flat-dysphoric Principal Problem: MDD/ETOH dep Diagnosis: Active Problems:   Severe recurrent major depression with psychotic features (HCC)   Alcohol withdrawal, with perceptual disturbance ([place])  Total Time spent with patient: 20 minutes  Past Psychiatric History: see eval  Past Medical History: History reviewed. No pertinent past medical history. History reviewed. No pertinent surgical history. Family History: History reviewed. No pertinent family history. Family Psychiatric  History: see eval Social History:  Social History   Substance and Sexual Activity  Alcohol Use Yes   Comment: 6 beers per day     Social History   Substance and Sexual Activity  Drug Use Yes  . Types: Amphetamines, Marijuana    Social History   Socioeconomic History  . Marital status: Single    Spouse name: Not on file  . Number of children: Not on file  . Years of education: Not on file  . Highest education level: Not on file  Occupational History  . Not on file  Social Needs  . Financial resource strain: Not on file  . Food insecurity    Worry: Not on file    Inability: Not on file  . Transportation needs    Medical: Not on file    Non-medical: Not on file  Tobacco Use  . Smoking status: Current Every Day Smoker    Packs/day: 1.50    Types: Cigarettes  . Smokeless tobacco: Never Used  Substance and Sexual Activity  . Alcohol use: Yes    Comment: 6 beers per day  . Drug use: Yes    Types: Amphetamines, Marijuana  . Sexual activity: Not on file  Lifestyle  . Physical activity    Days per week: Not on file    Minutes per session: Not on file  . Stress: Not on file  Relationships  . Social Herbalist on phone: Not on file    Gets  together: Not on file    Attends religious service: Not on file    Active member of club or organization: Not on file    Attends meetings of clubs or organizations: Not on file    Relationship status: Not on file  Other Topics Concern  . Not on file  Social History Narrative  . Not on file   Additional Social History:                         Sleep: Good  Appetite:  Good  Current Medications: Current Facility-Administered Medications  Medication Dose Route Frequency Provider Last Rate Last Dose  . acetaminophen (TYLENOL) tablet 650 mg  650 mg Oral Q6H PRN Rozetta Nunnery, NP      . alum & mag hydroxide-simeth (MAALOX/MYLANTA) 200-200-20 MG/5ML suspension 30 mL  30 mL Oral Q4H PRN Lindon Romp A, NP      . carvedilol (COREG) tablet 25 mg  25 mg Oral BID WC Johnn Hai, MD      . chlordiazePOXIDE (LIBRIUM) capsule 25 mg  25 mg Oral Q6H PRN Johnn Hai, MD   25 mg at 11/12/19 1737  . chlordiazePOXIDE (LIBRIUM) capsule 25 mg  25 mg Oral TID Johnn Hai, MD   25 mg at 11/13/19 1001   Followed  by  . Melene Muller[START ON 11/14/2019] chlordiazePOXIDE (LIBRIUM) capsule 25 mg  25 mg Oral Nicki GuadalajaraBH-qamhs Jozalyn Baglio, MD       Followed by  . [START ON 11/15/2019] chlordiazePOXIDE (LIBRIUM) capsule 25 mg  25 mg Oral Daily Malvin JohnsFarah, Briget Shaheed, MD      . chlordiazePOXIDE (LIBRIUM) capsule 50 mg  50 mg Oral Once Malvin JohnsFarah, Overton Boggus, MD      . hydrOXYzine (ATARAX/VISTARIL) tablet 25 mg  25 mg Oral TID PRN Jackelyn PolingBerry, Jason A, NP      . hydrOXYzine (ATARAX/VISTARIL) tablet 25 mg  25 mg Oral Q6H PRN Malvin JohnsFarah, Janiesha Diehl, MD   25 mg at 11/13/19 0117  . influenza vac split quadrivalent PF (FLUARIX) injection 0.5 mL  0.5 mL Intramuscular Tomorrow-1000 Malvin JohnsFarah, Sherlon Nied, MD      . loperamide (IMODIUM) capsule 2-4 mg  2-4 mg Oral PRN Malvin JohnsFarah, Deijah Spikes, MD      . magnesium hydroxide (MILK OF MAGNESIA) suspension 30 mL  30 mL Oral Daily PRN Nira ConnBerry, Jason A, NP      . multivitamin with minerals tablet 1 tablet  1 tablet Oral Daily Malvin JohnsFarah, Azaan Leask, MD   1  tablet at 11/12/19 1737  . nicotine (NICODERM CQ - dosed in mg/24 hours) patch 21 mg  21 mg Transdermal Daily Malvin JohnsFarah, Layken Beg, MD   21 mg at 11/12/19 1740  . ondansetron (ZOFRAN-ODT) disintegrating tablet 4 mg  4 mg Oral Q6H PRN Malvin JohnsFarah, Oda Placke, MD      . sertraline (ZOLOFT) tablet 100 mg  100 mg Oral Daily Malvin JohnsFarah, Colbi Schiltz, MD   100 mg at 11/12/19 1738  . thiamine (VITAMIN B-1) tablet 100 mg  100 mg Oral Daily Malvin JohnsFarah, Lorena Benham, MD        Lab Results:  Results for orders placed or performed during the hospital encounter of 11/12/19 (from the past 48 hour(s))  Hemoglobin A1c     Status: Abnormal   Collection Time: 11/13/19  6:46 AM  Result Value Ref Range   Hgb A1c MFr Bld 6.3 (H) 4.8 - 5.6 %    Comment: (NOTE) Pre diabetes:          5.7%-6.4% Diabetes:              >6.4% Glycemic control for   <7.0% adults with diabetes    Mean Plasma Glucose 134.11 mg/dL    Comment: Performed at [facility], [address] [place], [place] [address]  Lipid panel     Status: Abnormal   Collection Time: 11/13/19  6:46 AM  Result Value Ref Range   Cholesterol 190 0 - 200 mg/dL   Triglycerides 604178 (H) <150 mg/dL   HDL 47 >54>40 mg/dL   Total CHOL/HDL Ratio 4.0 RATIO   VLDL 36 0 - 40 mg/dL   LDL Cholesterol 098107 (H) 0 - 99 mg/dL    Comment:        Total Cholesterol/HDL:CHD Risk Coronary Heart Disease Risk Table                     Men   Women  1/2 Average Risk   3.4   3.3  Average Risk       5.0   4.4  2 X Average Risk   9.6   7.1  3 X Average Risk  23.4   11.0        Use the calculated Patient Ratio above and the CHD Risk Table to determine the patient's CHD Risk.  ATP III CLASSIFICATION (LDL):  <100     mg/dL   Optimal  836-629  mg/dL   Near or Above                    Optimal  130-159  mg/dL   Borderline  476-546  mg/dL   High  >503     mg/dL   Very High Performed at [facility], [address], [place], [place] [address]   TSH     Status: None   Collection Time:  11/13/19  6:46 AM  Result Value Ref Range   TSH 3.678 0.350 - 4.500 uIU/mL    Comment: Performed by a 3rd Generation assay with a functional sensitivity of <=0.01 uIU/mL. Performed at [facility], [address], [place], [place] [address]     Blood Alcohol level:  Lab Results  Component Value Date   ETH <10 11/11/2019    Metabolic Disorder Labs: Lab Results  Component Value Date   HGBA1C 6.3 (H) 11/13/2019   MPG 134.11 11/13/2019   MPG 123 04/22/2017   No results found for: PROLACTIN Lab Results  Component Value Date   CHOL 190 11/13/2019   TRIG 178 (H) 11/13/2019   HDL 47 11/13/2019   CHOLHDL 4.0 11/13/2019   VLDL 36 11/13/2019   LDLCALC 107 (H) 11/13/2019   LDLCALC 96 04/22/2017    Physical Findings: AIMS:  , ,  ,  ,    CIWA:  CIWA-Ar Total: 2 COWS:     Musculoskeletal: Strength & Muscle Tone: within normal limits Gait & Station: normal Patient leans: N/A  Psychiatric Specialty Exam: Physical Exam  ROS  Blood pressure (!) 135/104, pulse 69, temperature 97.8 F (36.6 C), temperature source Oral, resp. rate 18, height 5' 7.5" (1.715 m), weight 76.2 kg, SpO2 100 %.Body mass index is 25.92 kg/m.  General Appearance: Casual  Eye Contact:  Good  Speech:  Clear and Coherent  Volume:  Decreased  Mood:  Anxious and Depressed  Affect:  Blunt and Congruent  Thought Process:  Goal Directed and Linear  Orientation:  Full (Time, Place, and Person)  Thought Content:  Logical  Suicidal Thoughts:  No  Homicidal Thoughts:  No  Memory:  Immediate;   Fair Recent;   Fair Remote;   Fair  Judgement:  Good  Insight:  good  Psychomotor Activity:  Normal  Concentration:  Concentration: Fair and Attention Span: Fair  Recall:  [employer] of Knowledge:  Fair  Language:  Fair  Akathisia:  Negative  Handed:  Right  AIMS (if indicated):     Assets:  Communication Skills Desire for Improvement Leisure Time  ADL's:  Intact  Cognition:  WNL  Sleep:   Number of Hours: 7.25     Treatment Plan Summary: Daily contact with patient to assess and evaluate symptoms and progress in treatment and Medication management  Cont detox- add beta blocker address MDD w meds and therapy   Ameira Alessandrini, MD 11/13/2019, 10:02 AM

## 2019-11-13 NOTE — Tx Team (Signed)
Interdisciplinary Treatment and Diagnostic Plan Update  11/13/2019 Time of Session: 10:00am Carl Mcdaniel MRN: [id number]  Principal Diagnosis: <principal problem not specified>  Secondary Diagnoses: Active Problems:   Severe recurrent major depression with psychotic features (HCC)   Alcohol withdrawal, with perceptual disturbance (HCC)   Current Medications:  Current Facility-Administered Medications  Medication Dose Route Frequency Provider Last Rate Last Dose  . acetaminophen (TYLENOL) tablet 650 mg  650 mg Oral Q6H PRN Rozetta Nunnery, NP      . alum & mag hydroxide-simeth (MAALOX/MYLANTA) 200-200-20 MG/5ML suspension 30 mL  30 mL Oral Q4H PRN Lindon Romp A, NP      . carvedilol (COREG) tablet 25 mg  25 mg Oral BID WC Johnn Hai, MD      . chlordiazePOXIDE (LIBRIUM) capsule 25 mg  25 mg Oral Q6H PRN Johnn Hai, MD   25 mg at 11/12/19 1737  . chlordiazePOXIDE (LIBRIUM) capsule 25 mg  25 mg Oral TID Johnn Hai, MD       Followed by  . [START ON 11/14/2019] chlordiazePOXIDE (LIBRIUM) capsule 25 mg  25 mg Oral Celine Ahr, MD       Followed by  . [START ON 11/15/2019] chlordiazePOXIDE (LIBRIUM) capsule 25 mg  25 mg Oral Daily Johnn Hai, MD      . chlordiazePOXIDE (LIBRIUM) capsule 50 mg  50 mg Oral Once Johnn Hai, MD      . hydrOXYzine (ATARAX/VISTARIL) tablet 25 mg  25 mg Oral TID PRN Rozetta Nunnery, NP      . hydrOXYzine (ATARAX/VISTARIL) tablet 25 mg  25 mg Oral Q6H PRN Johnn Hai, MD   25 mg at 11/13/19 0117  . influenza vac split quadrivalent PF (FLUARIX) injection 0.5 mL  0.5 mL Intramuscular Tomorrow-1000 Johnn Hai, MD      . loperamide (IMODIUM) capsule 2-4 mg  2-4 mg Oral PRN Johnn Hai, MD      . magnesium hydroxide (MILK OF MAGNESIA) suspension 30 mL  30 mL Oral Daily PRN Lindon Romp A, NP      . multivitamin with minerals tablet 1 tablet  1 tablet Oral Daily Johnn Hai, MD   1 tablet at 11/12/19 1737  . nicotine (NICODERM CQ - dosed in  mg/24 hours) patch 21 mg  21 mg Transdermal Daily Johnn Hai, MD   21 mg at 11/12/19 1740  . ondansetron (ZOFRAN-ODT) disintegrating tablet 4 mg  4 mg Oral Q6H PRN Johnn Hai, MD      . sertraline (ZOLOFT) tablet 100 mg  100 mg Oral Daily Johnn Hai, MD   100 mg at 11/12/19 1738  . thiamine (VITAMIN B-1) tablet 100 mg  100 mg Oral Daily Johnn Hai, MD       PTA Medications: Medications Prior to Admission  Medication Sig Dispense Refill Last Dose  . ARIPiprazole (ABILIFY) 20 MG tablet Take 1 tablet (20 mg total) by mouth daily. (Patient not taking: Reported on 11/12/2019) 30 tablet 0   . ibuprofen (ADVIL) 200 MG tablet Take 200-400 mg by mouth every 6 (six) hours as needed (for lower back pain).     . nicotine (NICODERM CQ - DOSED IN MG/24 HOURS) 21 mg/24hr patch Place 1 patch (21 mg total) onto the skin daily. (Patient not taking: Reported on 11/12/2019) 28 patch 0   . sertraline (ZOLOFT) 50 MG tablet Take 1 tablet (50 mg total) by mouth daily. (Patient not taking: Reported on 11/12/2019) 30 tablet 0   . traMADol (ULTRAM) 50  MG tablet Take 1 tablet (50 mg total) by mouth every 6 (six) hours as needed. (Patient not taking: Reported on 11/12/2019) 12 tablet 0   . traZODone (DESYREL) 100 MG tablet Take 1 tablet (100 mg total) by mouth at bedtime as needed for sleep. (Patient not taking: Reported on 11/12/2019) 30 tablet 0     Patient Stressors:    Patient Strengths:    Treatment Modalities: Medication Management, Group therapy, Case management,  1 to 1 session with clinician, Psychoeducation, Recreational therapy.   Physician Treatment Plan for Primary Diagnosis: <principal problem not specified> Long Term Goal(s): Improvement in symptoms so as ready for discharge Improvement in symptoms so as ready for discharge   Short Term Goals: Ability to verbalize feelings will improve Ability to disclose and discuss suicidal ideas Ability to demonstrate self-control will improve Ability to  identify and develop effective coping behaviors will improve Ability to maintain clinical measurements within normal limits will improve Compliance with prescribed medications will improve Ability to identify triggers associated with substance abuse/mental health issues will improve Ability to identify changes in lifestyle to reduce recurrence of condition will improve Ability to verbalize feelings will improve Ability to disclose and discuss suicidal ideas Ability to demonstrate self-control will improve Ability to identify and develop effective coping behaviors will improve  Medication Management: Evaluate patient's response, side effects, and tolerance of medication regimen.  Therapeutic Interventions: 1 to 1 sessions, Unit Group sessions and Medication administration.  Evaluation of Outcomes: Not Met  Physician Treatment Plan for Secondary Diagnosis: Active Problems:   Severe recurrent major depression with psychotic features ([place])   Alcohol withdrawal, with perceptual disturbance ([place])  Long Term Goal(s): Improvement in symptoms so as ready for discharge Improvement in symptoms so as ready for discharge   Short Term Goals: Ability to verbalize feelings will improve Ability to disclose and discuss suicidal ideas Ability to demonstrate self-control will improve Ability to identify and develop effective coping behaviors will improve Ability to maintain clinical measurements within normal limits will improve Compliance with prescribed medications will improve Ability to identify triggers associated with substance abuse/mental health issues will improve Ability to identify changes in lifestyle to reduce recurrence of condition will improve Ability to verbalize feelings will improve Ability to disclose and discuss suicidal ideas Ability to demonstrate self-control will improve Ability to identify and develop effective coping behaviors will improve     Medication Management:  Evaluate patient's response, side effects, and tolerance of medication regimen.  Therapeutic Interventions: 1 to 1 sessions, Unit Group sessions and Medication administration.  Evaluation of Outcomes: Not Met   RN Treatment Plan for Primary Diagnosis: <principal problem not specified> Long Term Goal(s): Knowledge of disease and therapeutic regimen to maintain health will improve  Short Term Goals: Ability to demonstrate self-control, Ability to participate in decision making will improve, Ability to identify and develop effective coping behaviors will improve and Compliance with prescribed medications will improve  Medication Management: RN will administer medications as ordered by provider, will assess and evaluate patient's response and provide education to patient for prescribed medication. RN will report any adverse and/or side effects to prescribing provider.  Therapeutic Interventions: 1 on 1 counseling sessions, Psychoeducation, Medication administration, Evaluate responses to treatment, Monitor vital signs and CBGs as ordered, Perform/monitor CIWA, COWS, AIMS and Fall Risk screenings as ordered, Perform wound care treatments as ordered.  Evaluation of Outcomes: Not Met   LCSW Treatment Plan for Primary Diagnosis: <principal problem not specified> Long Term Goal(s): Safe transition to  appropriate next level of care at discharge, Engage patient in therapeutic group addressing interpersonal concerns.  Short Term Goals: Engage patient in aftercare planning with referrals and resources, Increase ability to appropriately verbalize feelings, Increase emotional regulation, Identify triggers associated with mental health/substance abuse issues and Increase skills for wellness and recovery  Therapeutic Interventions: Assess for all discharge needs, 1 to 1 time with Social worker, Explore available resources and support systems, Assess for adequacy in community support network, Educate family  and significant other(s) on suicide prevention, Complete Psychosocial Assessment, Interpersonal group therapy.  Evaluation of Outcomes: Not Met  Progress in Treatment: Attending groups: No. Participating in groups: No. Taking medication as prescribed: Yes. Toleration medication: Yes. Family/Significant other contact made: No, will contact:  supports if consents are granted. Patient understands diagnosis: No. Discussing patient identified problems/goals with staff: Yes. Medical problems stabilized or resolved: No. Denies suicidal/homicidal ideation: No. Issues/concerns per patient self-inventory: Yes.  New problem(s) identified: Yes, Describe:  unstable housing.  New Short Term/Long Term Goal(s): detox, medication management for mood stabilization; elimination of SI thoughts; development of comprehensive mental wellness/sobriety plan.  Patient Goals:  "detox"  Discharge Plan or Barriers: Patient newly admitted to unit, CSW assessing for appropriate referrals.   Reason for Continuation of Hospitalization: Anxiety Depression Medication stabilization Suicidal ideation Withdrawal symptoms  Estimated Length of Stay: 3-5 days  Attendees: Patient: Carl Mcdaniel 11/13/2019 9:33 AM  Physician: Dr.Farah 11/13/2019 9:33 AM  Nursing: Vladimir Faster, RN 11/13/2019 9:33 AM  RN Care Manager: 11/13/2019 9:33 AM  Social Worker: Stephanie Acre, [place] 11/13/2019 9:33 AM  Recreational Therapist:  11/13/2019 9:33 AM  Other:  11/13/2019 9:33 AM  Other:  11/13/2019 9:33 AM  Other: 11/13/2019 9:33 AM    Scribe for Treatment Team: Joellen [place], [place] 11/13/2019 9:33 AM

## 2019-11-14 NOTE — [facility] Counselor (Signed)
CSW met with patient at bedside to discuss discharge planning and preferences for follow up. Patient reports that he wants to wait until tomorrow, prior to discharge, to decide on follow up and referrals. Patient states he is interested in residential substance use treatment, but he wants to think it over tonight and discuss it with his girlfriend. Patient has been made aware that he would not be able to discharge directly to a residential treatment program, especially if he waits until the morning of discharge to decide. At this time, he declines outpatient follow up.   , MSW, LCSW-A Clinical Social Worker [facility]  [phone] 

## 2019-11-14 NOTE — [facility] Suicide Risk Assessment (Signed)
[facility] INPATIENT:  Family/Significant Other Suicide Prevention Education  Suicide Prevention Education:  Patient Refusal for Family/Significant Other Suicide Prevention Education: The patient Carl Mcdaniel has refused to provide written consent for family/significant other to be provided Family/Significant Other Suicide Prevention Education during admission and/or prior to discharge.  Physician notified.  Biance Moncrief T Myalynn Lingle 11/14/2019, 10:41 AM

## 2019-11-14 NOTE — Progress Notes (Signed)
[facility] MD Progress Note  11/14/2019 8:00 AM Carl Mcdaniel  MRN:  [id number] Subjective:   Patient in his room still has a flat affect continues to endorse depressive symptoms but no current suicidal thoughts plans or intent  Detox has progressed his blood pressure is down  Patient reports now that he may not have anywhere to stay so he is going to look for some type of rehab he told me that he had his own place initially but now elaborates that he was staying with a friend and is not sure he is welcome back Principal Problem: <principal problem not specified> Diagnosis: Active Problems:   Severe recurrent major depression with psychotic features ([place])   Alcohol withdrawal, with perceptual disturbance ([place])  Total Time spent with patient: 20 minutes  Past Psychiatric History: see eval  Past Medical History: History reviewed. No pertinent past medical history. History reviewed. No pertinent surgical history. Family History: History reviewed. No pertinent family history. Family Psychiatric  History: see eval Social History:  Social History   Substance and Sexual Activity  Alcohol Use Yes   Comment: 6 beers per day     Social History   Substance and Sexual Activity  Drug Use Yes  . Types: Amphetamines, Marijuana    Social History   Socioeconomic History  . Marital status: Single    Spouse name: Not on file  . Number of children: Not on file  . Years of education: Not on file  . Highest education level: Not on file  Occupational History  . Not on file  Tobacco Use  . Smoking status: Current Every Day Smoker    Packs/day: 1.50    Types: Cigarettes  . Smokeless tobacco: Never Used  Substance and Sexual Activity  . Alcohol use: Yes    Comment: 6 beers per day  . Drug use: Yes    Types: Amphetamines, Marijuana  . Sexual activity: Not on file  Other Topics Concern  . Not on file  Social History Narrative  . Not on file   Social Determinants of Health   Financial  Resource Strain:   . Difficulty of Paying Living Expenses: Not on file  Food Insecurity:   . Worried About Charity fundraiser in the Last Year: Not on file  . Ran Out of Food in the Last Year: Not on file  Transportation Needs:   . Lack of Transportation (Medical): Not on file  . Lack of Transportation (Non-Medical): Not on file  Physical Activity:   . Days of Exercise per Week: Not on file  . Minutes of Exercise per Session: Not on file  Stress:   . Feeling of Stress : Not on file  Social Connections:   . Frequency of Communication with Friends and Family: Not on file  . Frequency of Social Gatherings with Friends and Family: Not on file  . Attends Religious Services: Not on file  . Active Member of Clubs or Organizations: Not on file  . Attends Archivist Meetings: Not on file  . Marital Status: Not on file   Additional Social History:                         Sleep: Good  Appetite:  Good  Current Medications: Current Facility-Administered Medications  Medication Dose Route Frequency Provider Last Rate Last Admin  . acetaminophen (TYLENOL) tablet 650 mg  650 mg Oral Q6H PRN Rozetta Nunnery, NP      .  alum & mag hydroxide-simeth (MAALOX/MYLANTA) 200-200-20 MG/5ML suspension 30 mL  30 mL Oral Q4H PRN Nira ConnBerry, Jason A, NP      . carvedilol (COREG) tablet 25 mg  25 mg Oral BID WC Malvin JohnsFarah, Eyleen Rawlinson, MD   25 mg at 11/14/19 0747  . chlordiazePOXIDE (LIBRIUM) capsule 25 mg  25 mg Oral Q6H PRN Malvin JohnsFarah, Christien Berthelot, MD   25 mg at 11/12/19 1737  . chlordiazePOXIDE (LIBRIUM) capsule 25 mg  25 mg Oral Nicki GuadalajaraBH-qamhs Devery Odwyer, MD   25 mg at 11/14/19 0747   Followed by  . [START ON 11/15/2019] chlordiazePOXIDE (LIBRIUM) capsule 25 mg  25 mg Oral Daily Malvin JohnsFarah, Imara Standiford, MD      . chlordiazePOXIDE (LIBRIUM) capsule 50 mg  50 mg Oral Once Malvin JohnsFarah, Marcas Bowsher, MD      . hydrOXYzine (ATARAX/VISTARIL) tablet 25 mg  25 mg Oral TID PRN Jackelyn PolingBerry, Jason A, NP      . hydrOXYzine (ATARAX/VISTARIL) tablet 25 mg   25 mg Oral Q6H PRN Malvin JohnsFarah, Ichelle Harral, MD   25 mg at 11/13/19 2108  . loperamide (IMODIUM) capsule 2-4 mg  2-4 mg Oral PRN Malvin JohnsFarah, Penney Domanski, MD   2 mg at 11/13/19 2108  . magnesium hydroxide (MILK OF MAGNESIA) suspension 30 mL  30 mL Oral Daily PRN Nira ConnBerry, Jason A, NP      . multivitamin with minerals tablet 1 tablet  1 tablet Oral Daily Malvin JohnsFarah, Reginold Beale, MD   1 tablet at 11/14/19 0747  . nicotine (NICODERM CQ - dosed in mg/24 hours) patch 21 mg  21 mg Transdermal Daily Malvin JohnsFarah, Elanah Osmanovic, MD   21 mg at 11/14/19 0747  . ondansetron (ZOFRAN-ODT) disintegrating tablet 4 mg  4 mg Oral Q6H PRN Malvin JohnsFarah, Akim Watkinson, MD      . sertraline (ZOLOFT) tablet 100 mg  100 mg Oral Daily Malvin JohnsFarah, Azya Barbero, MD   100 mg at 11/14/19 0747  . thiamine (VITAMIN B-1) tablet 100 mg  100 mg Oral Daily Malvin JohnsFarah, Hazelgrace Bonham, MD   100 mg at 11/14/19 0747  . traZODone (DESYREL) tablet 50 mg  50 mg Oral QHS PRN Anike, Adaku C, NP   50 mg at 11/13/19 2108    Lab Results:  Results for orders placed or performed during the hospital encounter of 11/12/19 (from the past 48 hour(s))  Hemoglobin A1c     Status: Abnormal   Collection Time: 11/13/19  6:46 AM  Result Value Ref Range   Hgb A1c MFr Bld 6.3 (H) 4.8 - 5.6 %    Comment: (NOTE) Pre diabetes:          5.7%-6.4% Diabetes:              >6.4% Glycemic control for   <7.0% adults with diabetes    Mean Plasma Glucose 134.11 mg/dL    Comment: Performed at [facility], [address] [place], [place] [address]  Lipid panel     Status: Abnormal   Collection Time: 11/13/19  6:46 AM  Result Value Ref Range   Cholesterol 190 0 - 200 mg/dL   Triglycerides 604178 (H) <150 mg/dL   HDL 47 >54>40 mg/dL   Total CHOL/HDL Ratio 4.0 RATIO   VLDL 36 0 - 40 mg/dL   LDL Cholesterol 098107 (H) 0 - 99 mg/dL    Comment:        Total Cholesterol/HDL:CHD Risk Coronary Heart Disease Risk Table                     Men  Women  1/2 Average Risk   3.4   3.3  Average Risk       5.0   4.4  2 X Average Risk   9.6   7.1  3 X Average  Risk  23.4   11.0        Use the calculated Patient Ratio above and the CHD Risk Table to determine the patient's CHD Risk.        ATP III CLASSIFICATION (LDL):  <100     mg/dL   Optimal  732-202  mg/dL   Near or Above                    Optimal  130-159  mg/dL   Borderline  542-706  mg/dL   High  >237     mg/dL   Very High Performed at [facility], [address] [place], [place] [address]   TSH     Status: None   Collection Time: 11/13/19  6:46 AM  Result Value Ref Range   TSH 3.678 0.350 - 4.500 uIU/mL    Comment: Performed by a 3rd Generation assay with a functional sensitivity of <=0.01 uIU/mL. Performed at [facility], [address] [place], [place] [address]     Blood Alcohol level:  Lab Results  Component Value Date   ETH <10 11/11/2019    Metabolic Disorder Labs: Lab Results  Component Value Date   HGBA1C 6.3 (H) 11/13/2019   MPG 134.11 11/13/2019   MPG 123 04/22/2017   No results found for: PROLACTIN Lab Results  Component Value Date   CHOL 190 11/13/2019   TRIG 178 (H) 11/13/2019   HDL 47 11/13/2019   CHOLHDL 4.0 11/13/2019   VLDL 36 11/13/2019   LDLCALC 107 (H) 11/13/2019   LDLCALC 96 04/22/2017    Physical Findings: AIMS:  , ,  ,  ,    CIWA:  CIWA-Ar Total: 2 COWS:     Musculoskeletal: Strength & Muscle Tone: within normal limits Gait & Station: normal Patient leans: N/A  Psychiatric Specialty Exam: Physical Exam  Review of Systems  Blood pressure 106/78, pulse 61, temperature 98 F (36.7 C), temperature source Oral, resp. rate 18, height 5' 7.5" (1.715 m), weight 76.2 kg, SpO2 100 %.Body mass index is 25.92 kg/m.  General Appearance: Casual  Eye Contact:  Minimal  Speech:  Slow  Volume:  Decreased  Mood:  Dysphoric  Affect:  Blunt  Thought Process:  Coherent, Goal Directed and Descriptions of Associations: Intact  Orientation:  Full (Time, Place, and Person)  Thought Content:  Logical   Suicidal Thoughts:  No  Homicidal Thoughts:  No  Memory:  Immediate;   Fair Recent;   Fair Remote;   Fair  Judgement:  Intact  Insight:  Fair  Psychomotor Activity:  Normal  Concentration:  Concentration: Fair and Attention Span: Fair  Recall:  [employer] of Knowledge:  Fair  Language:  Fair  Akathisia:  Negative  Handed:  Right  AIMS (if indicated):     Assets:  Communication Skills Leisure Time Physical Health  ADL's:  Intact  Cognition:  WNL  Sleep:  Number of Hours: 7.25     Treatment Plan Summary: Daily contact with patient to assess and evaluate symptoms and progress in treatment and Medication management  Complete detox regimen-plan for discharge tomorrow Continue antidepressant therapy Continue cognitive rehab therapy Continue current precautions Explore rehab options with social work  Malvin Johns, MD 11/14/2019,  8:00 AM
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
## 2019-11-14 NOTE — Progress Notes (Signed)
Recreation Therapy Notes  Date: 12.10.20 Time: 1000 Location: 500 Hall Dayroom  Group Topic: Communication, Team Building, Problem Solving  Goal Area(s) Addresses:  Patient will effectively work with peer towards shared goal.  Patient will identify skill used to make activity successful.  Patient will identify how skills used during activity can be used to reach post d/c goals.   Intervention: STEM Activity   Activity: In team's, using 10 red plastic cups, patients were asked to build a free standing tower possible.    Education: Education officer, community, Dentist.   Education Outcome: Acknowledges education/In group clarification offered/Needs additional education.   Clinical Observations/Feedback: Pt did not attend group session.     Victorino Sparrow, LRT/CTRS         Victorino Sparrow A 11/14/2019 11:36 AM

## 2019-11-14 NOTE — Progress Notes (Signed)
   11/14/19 2200  Psych Admission Type (Psych Patients Only)  Admission Status Voluntary  Psychosocial Assessment  Patient Complaints None  Eye Contact Brief  Facial Expression Anxious  Affect Depressed;Sad  Speech Logical/coherent  Interaction Minimal  Motor Activity Slow  Appearance/Hygiene Disheveled  Behavior Characteristics Cooperative  Mood Depressed  Thought Process  Coherency WDL  Content WDL  Delusions WDL;None reported or observed  Perception WDL  Hallucination None reported or observed  Judgment WDL  Confusion WDL  Danger to Self  Current suicidal ideation? Passive  Self-Injurious Behavior Some self-injurious ideation observed or expressed.  No lethal plan expressed   Agreement Not to Harm Self Yes  Description of Agreement verbal contract for safety   Pt stated he was feeling better. Pt plans to go to [facility] next week

## 2019-11-15 MED ORDER — SERTRALINE HCL 100 MG PO TABS: 100.0000 mg | ORAL_TABLET | Freq: Every day | ORAL | 1 refills | Status: AC

## 2019-11-15 MED ORDER — NALTREXONE HCL 50 MG PO TABS: 50.0000 mg | ORAL_TABLET | Freq: Every day | ORAL | 1 refills | Status: AC

## 2019-11-15 NOTE — BHH Suicide Risk Assessment (Signed)
[facility] Discharge Suicide Risk Assessment   Principal Problem: Depression/alcoholism Discharge Diagnoses: Active Problems:   Severe recurrent major depression with psychotic features ([facility])   Alcohol withdrawal, with perceptual disturbance ([facility])   Total Time spent with patient: 45 minutes Musculoskeletal: Strength & Muscle Tone: within normal limits Gait & Station: normal Patient leans: N/A  Psychiatric Specialty Exam: Physical Exam  Review of Systems  Blood pressure 107/74, pulse (!) 57, temperature 98 F (36.7 C), temperature source Oral, resp. rate 18, height 5' 7.5" (1.715 m), weight 76.2 kg, SpO2 100 %.Body mass index is 25.92 kg/m.  General Appearance: Casual  Eye Contact:  Fair  Speech:  Clear and Coherent  Volume:  Decreased  Mood:  Dysphoric  Affect:  Appropriate  Thought Process:  Coherent  Orientation:  Full (Time, Place, and Person)  Thought Content:  Logical  Suicidal Thoughts:  No  Homicidal Thoughts:  No  Memory:  Immediate;   Fair Recent;   Fair Remote;   Fair  Judgement:  Fair  Insight:  Fair  Psychomotor Activity:  Normal  Concentration:  Concentration: Good and Attention Span: Good  Recall:  Good  Fund of Knowledge:  Good  Language:  Good  Akathisia:  Negative  Handed:  Right  AIMS (if indicated):     Assets:  Communication Skills Leisure Time Physical Health  ADL's:  Intact  Cognition:  WNL  Sleep:  Number of Hours: 6.25  Mental Status Per Nursing Assessment::   On Admission:  Suicidal ideation indicated by patient  Demographic Factors:  Male with substance abuse problems possibly homeless Loss Factors: Decrease in vocational status  Historical Factors: NA  Risk Reduction Factors:   Sense of responsibility to family and Religious beliefs about death  Continued Clinical Symptoms:  Alcohol/Substance Abuse/Dependencies  Cognitive Features That Contribute To Risk:  Polarized thinking    Suicide Risk:  Minimal: No identifiable suicidal  ideation.  Patients presenting with no risk factors but with morbid ruminations; may be classified as minimal risk based on the severity of the depressive symptoms    Plan Of Care/Follow-up recommendations:  Activity:  full  Carl Pop, MD 11/15/2019, 8:22 AM

## 2019-11-15 NOTE — Progress Notes (Signed)
Recreation Therapy Notes  Date: 12.11.20 Time: 1000 Location: 500 Hall Dayroom  Group Topic: Self-Esteem  Goal Area(s) Addresses:  Patient will successfully identify positive attributes about themselves.  Patient will successfully identify benefit of improved self-esteem.   Behavioral Response: None  Intervention: Construction paper, colored pencils, music  Activity: Personalized License Plate.  Patients were to create a personalized license plate that focused on the good things about themselves.  Patients could also highlight important dates, accomplishments or things hoped to accomplish.  Education:  Self-Esteem, Dentist.   Education Outcome: Acknowledges education/In group clarification offered/Needs additional education  Clinical Observations/Feedback:  Pt did not participate.  Pt sat and observed and listened to the music.      Victorino Sparrow, LRT/CTRS    Victorino Sparrow A 11/15/2019 11:39 AM

## 2019-11-15 NOTE — Progress Notes (Signed)
Patient shared in group that he had a "pretty good" day overall as he was able to rest and have a good visit with his girlfriend. His goal for tomorrow is to get discharged and go to a treatment facility.

## 2019-11-15 NOTE — Progress Notes (Signed)
  [facility] Adult Case Management Discharge Plan :  Will you be returning to the same living situation after discharge:  No. Staying with a friend. At discharge, do you have transportation home?: Yes,  Lyft to his vehicle at [facility]. Do you have the ability to pay for your medications: Yes,  Medicaid.  Release of information consent forms completed and in the chart; letter on chart.  Patient to Follow up at: Follow-up Information    [facility] Follow up on 11/21/2019.   Why: Your hospital discharge appointment will be held by phone on 12/17 at 2:00pm. The appointment will be held by phone and the provider will call you. Please have access to your hospital discharge paperwork for this appointment. Contact information: [address] [place] [address] [phone]        [facility]. Call on 11/18/2019.   Why: Referral for residential substance use treatment was faxed on 12/11. Please call and ask to speak with Ms.June to follow up. If accepted for treatment, you need to bring a 30 day supply of medications and your hospital discharge paperwork. Contact information: [address] [place] [address] [phone]           Next level of care provider has access to [facility] and Suicide Prevention discussed: Yes,  with patient, patient declines consents.   Has patient been referred to the Quitline?: Patient refused referral  Patient has been referred for addiction treatment: Yes  Joellen Jersey, [place] 11/15/2019, 11:52 AM

## 2019-11-15 NOTE — Progress Notes (Signed)
Recreation Therapy Notes  INPATIENT RECREATION TR PLAN  Patient Details Name: Carl Mcdaniel MRN: [id number] DOB: [birth year] Today's Date: 11/15/2019  Rec Therapy Plan Is patient appropriate for Therapeutic Recreation?: Yes Treatment times per week: about 3 days Estimated Length of Stay: 5-7 days TR Treatment/Interventions: Group participation (Comment)  Discharge Criteria Pt will be discharged from therapy if:: Discharged Treatment plan/goals/alternatives discussed and agreed upon by:: Patient/family  Discharge Summary Short term goals set: See patient care plan Short term goals met: Not met Progress toward goals comments: Groups attended Which groups?: Self-esteem Reason goals not met: Pt attended one group session Therapeutic equipment acquired: N/A Reason patient discharged from therapy: Discharge from hospital Pt/family agrees with progress & goals achieved: Yes Date patient discharged from therapy: 11/15/19    Victorino Sparrow, LRT/CTRS  Ria Comment, [place] 11/15/2019, 12:06 PM

## 2019-11-15 NOTE — Progress Notes (Signed)
Pt discharged to lobby. Pt was stable and appreciative at that time. All papers and prescriptions were given and valuables returned. Verbal understanding expressed. Denies SI/HI and A/VH. Pt given opportunity to express concerns and ask questions.  

## 2019-11-15 NOTE — Plan of Care (Signed)
Pt attended one recreation therapy group session.    Belia Febo, LRT/CTRS 
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
## 2022-01-20 ENCOUNTER — Other Ambulatory Visit (HOSPITAL_COMMUNITY): Payer: Self-pay | Admitting: Gastroenterology

## 2022-01-20 ENCOUNTER — Other Ambulatory Visit: Payer: Self-pay | Admitting: Gastroenterology

## 2022-01-20 DIAGNOSIS — R112 Nausea with vomiting, unspecified: Secondary | ICD-10-CM

## 2022-01-26 ENCOUNTER — Encounter (HOSPITAL_COMMUNITY)
Admission: RE | Admit: 2022-01-26 | Discharge: 2022-01-26 | Disposition: A | Discharge status: Home / Self Care | Payer: Medicaid Other | Source: Ambulatory Visit | Attending: Gastroenterology | Admitting: Gastroenterology

## 2022-01-26 ENCOUNTER — Other Ambulatory Visit: Payer: Self-pay

## 2022-01-26 DIAGNOSIS — R112 Nausea with vomiting, unspecified: Secondary | ICD-10-CM | POA: Diagnosis present

## 2022-01-26 MED ORDER — TECHNETIUM TC 99M SULFUR COLLOID FILTERED: 1.9000 | Freq: Once | INTRAVENOUS | Status: DC | PRN

## 2022-07-11 ENCOUNTER — Encounter (HOSPITAL_COMMUNITY): Payer: Self-pay | Admitting: Emergency Medicine

## 2022-07-11 ENCOUNTER — Emergency Department (HOSPITAL_COMMUNITY)
Admission: EM | Admit: 2022-07-11 | Discharge: 2022-07-11 | Disposition: A | Discharge status: Home / Self Care | Payer: Medicaid Other | Attending: Emergency Medicine | Admitting: Emergency Medicine

## 2022-07-11 ENCOUNTER — Emergency Department (HOSPITAL_COMMUNITY): Payer: Medicaid Other

## 2022-07-11 DIAGNOSIS — R1032 Left lower quadrant pain: Secondary | ICD-10-CM | POA: Diagnosis present

## 2022-07-11 DIAGNOSIS — K5790 Diverticulosis of intestine, part unspecified, without perforation or abscess without bleeding: Secondary | ICD-10-CM | POA: Diagnosis not present

## 2022-07-11 DIAGNOSIS — R195 Other fecal abnormalities: Secondary | ICD-10-CM | POA: Insufficient documentation

## 2022-07-11 LAB — COMPREHENSIVE METABOLIC PANEL
ALT: 25 U/L (ref 0–44)
AST: 27 U/L (ref 15–41)
Albumin: 3.4 g/dL — ABNORMAL LOW (ref 3.5–5.0)
Alkaline Phosphatase: 44 U/L (ref 38–126)
Anion gap: 8 (ref 5–15)
BUN: <5 mg/dL — ABNORMAL LOW (ref 6–20)
CO2: 24 mmol/L (ref 22–32)
Calcium: 8.9 mg/dL (ref 8.9–10.3)
Chloride: 108 mmol/L (ref 98–111)
Creatinine, Ser: 0.78 mg/dL (ref 0.61–1.24)
GFR, Estimated: >60 mL/min (ref >60)
Glucose, Bld: 111 mg/dL — ABNORMAL HIGH (ref 70–99)
Potassium: 4.4 mmol/L (ref 3.5–5.1)
Sodium: 140 mmol/L (ref 135–145)
Total Bilirubin: 0.6 mg/dL (ref 0.3–1.2)
Total Protein: 6.1 g/dL — ABNORMAL LOW (ref 6.5–8.1)

## 2022-07-11 LAB — CBC WITH DIFFERENTIAL/PLATELET
Abs Immature Granulocytes: 0.04 10*3/uL (ref 0.00–0.07)
Basophils Absolute: 0.1 10*3/uL (ref 0.0–0.1)
Basophils Relative: 1 %
Eosinophils Absolute: 0.2 10*3/uL (ref 0.0–0.5)
Eosinophils Relative: 2 %
HCT: 51.2 % (ref 39.0–52.0)
Hemoglobin: 17.1 g/dL — ABNORMAL HIGH (ref 13.0–17.0)
Immature Granulocytes: 0 %
Lymphocytes Relative: 18 %
Lymphs Abs: 1.6 10*3/uL (ref 0.7–4.0)
MCH: 29.9 pg (ref 26.0–34.0)
MCHC: 33.4 g/dL (ref 30.0–36.0)
MCV: 89.7 fL (ref 80.0–100.0)
Monocytes Absolute: 0.5 10*3/uL (ref 0.1–1.0)
Monocytes Relative: 5 %
Neutro Abs: 6.7 10*3/uL (ref 1.7–7.7)
Neutrophils Relative %: 74 %
Platelets: 203 10*3/uL (ref 150–400)
RBC: 5.71 MIL/uL (ref 4.22–5.81)
RDW: 16.1 % — ABNORMAL HIGH (ref 11.5–15.5)
WBC: 9 10*3/uL (ref 4.0–10.5)
nRBC: 0 % (ref 0.0–0.2)

## 2022-07-11 LAB — LIPASE, BLOOD: Lipase: 38 U/L (ref 11–51)

## 2022-07-11 LAB — POC OCCULT BLOOD, ED: Fecal Occult Bld: NEGATIVE

## 2022-07-11 NOTE — ED Provider Triage Note (Signed)
Emergency Medicine Provider Triage Evaluation Note  Carl Mcdaniel , a 41 y.o. male  was evaluated in triage.  Pt complains of blood in stools and abdominal pain.  Patient states he is currently followed by GI for ulcerative colitis and some sort of esophageal issue.  He states that he was told by his GI doctor to come to the emergency department if he noticed blood either in vomit or in stool.  The patient states that for the past few days he has noticed blood in his stools.  Blood is described as bright red in nature.  He does endorse also having internal hemorrhoids.  He is not currently taking steroids or other specific treatments for his ulcerative colitis but does take dicyclomine for spasms.  Patient's abdominal pain is located in the left lower quadrant  Review of Systems  Positive: Abdominal pain, blood in stool Negative: Shortness of breath, chest pain  Physical Exam  BP (!) 147/106   Pulse 86   Temp 98.8 F (37.1 C) (Oral)   Resp 16   SpO2 97%  Gen:   Awake, no distress   Resp:  Normal effort  MSK:   Moves extremities without difficulty  Other:    Medical Decision Making  Medically screening exam initiated at 2:56 PM.  Appropriate orders placed.  Carl Mcdaniel was informed that the remainder of the evaluation will be completed by another provider, this initial triage assessment does not replace that evaluation, and the importance of remaining in the ED until their evaluation is complete.     Darrick Grinder, PA-C 07/11/22 1458

## 2022-07-11 NOTE — ED Notes (Signed)
Patient denies pain and is resting comfortably.   No signs of acute distress noted, awaiting ED provider evaluation.

## 2022-07-11 NOTE — ED Provider Notes (Signed)
[facility] EMERGENCY DEPARTMENT Provider Note   CSN: [id number] Arrival date & time: 07/11/22  1402     History  Chief Complaint  Patient presents with   Rectal Bleeding    Carl Mcdaniel is a 41 y.o. male.  Patient with history of ulcerative colitis follows close with gastroenterologist at [facility] presents after second episode of bright red blood in the stool.  Patient's had this before with hemorrhoids and polyps.  Patient takes dicyclomine for spasms.  Patient having mild left lower abdominal discomfort.  No fevers or chills, no active vomiting.  Patient denies blood thinner use.  No shortness of breath or chest pain.       Home Medications Prior to Admission medications   Medication Sig Start Date End Date Taking? Authorizing Provider  ibuprofen (ADVIL) 200 MG tablet Take 200-400 mg by mouth every 6 (six) hours as needed (for lower back pain).    [provider]  naltrexone (DEPADE) 50 MG tablet Take 1 tablet (50 mg total) by mouth daily. 11/15/19   Malvin Johns, MD  nicotine (NICODERM CQ - DOSED IN MG/24 HOURS) 21 mg/24hr patch Place 1 patch (21 mg total) onto the skin daily. Patient not taking: Reported on 11/12/2019 04/29/17   Adonis Brook, NP  sertraline (ZOLOFT) 100 MG tablet Take 1 tablet (100 mg total) by mouth daily. 11/16/19   Malvin Johns, MD  traZODone (DESYREL) 100 MG tablet Take 1 tablet (100 mg total) by mouth at bedtime as needed for sleep. Patient not taking: Reported on 11/12/2019 04/28/17   Adonis Brook, NP      Allergies    Shellfish-derived products    Review of Systems   Review of Systems  Constitutional:  Negative for chills and fever.  HENT:  Negative for congestion.   Eyes:  Negative for visual disturbance.  Respiratory:  Negative for shortness of breath.   Cardiovascular:  Negative for chest pain.  Gastrointestinal:  Positive for abdominal pain and blood in stool. Negative for vomiting.   Genitourinary:  Negative for dysuria and flank pain.  Musculoskeletal:  Negative for back pain, neck pain and neck stiffness.  Skin:  Negative for rash.  Neurological:  Negative for light-headedness and headaches.    Physical Exam Updated Vital Signs BP (!) 161/101   Pulse (!) 59   Temp 98.6 F (37 C)   Resp 15   SpO2 97%  Physical Exam Vitals and nursing note reviewed.  Constitutional:      General: He is not in acute distress.    Appearance: He is well-developed.  HENT:     Head: Normocephalic and atraumatic.     Mouth/Throat:     Mouth: Mucous membranes are moist.  Eyes:     General:        Right eye: No discharge.        Left eye: No discharge.     Conjunctiva/sclera: Conjunctivae normal.  Neck:     Trachea: No tracheal deviation.  Cardiovascular:     Rate and Rhythm: Normal rate and regular rhythm.     Heart sounds: No murmur heard. Pulmonary:     Effort: Pulmonary effort is normal.     Breath sounds: Normal breath sounds.  Abdominal:     General: There is no distension.     Palpations: Abdomen is soft.     Tenderness: There is abdominal tenderness. There is no guarding.  Genitourinary:    Comments: Normal rectal tone, no gross  blood, no external hemorrhoids or polyps visualized. Musculoskeletal:     Cervical back: Normal range of motion and neck supple. No rigidity.  Skin:    General: Skin is warm.     Capillary Refill: Capillary refill takes less than 2 seconds.     Findings: No rash.  Neurological:     General: No focal deficit present.     Mental Status: He is alert.     Cranial Nerves: Cranial nerve deficit: mild LLQ.  Psychiatric:        Mood and Affect: Mood normal.     ED Results / Procedures / Treatments   Labs (all labs ordered are listed, but only abnormal results are displayed) Labs Reviewed  COMPREHENSIVE METABOLIC PANEL - Abnormal; Notable for the following components:      Result Value   Glucose, Bld 111 (*)    BUN <5 (*)    Total  Protein 6.1 (*)    Albumin 3.4 (*)    All other components within normal limits  CBC WITH DIFFERENTIAL/PLATELET - Abnormal; Notable for the following components:   Hemoglobin 17.1 (*)    RDW 16.1 (*)    All other components within normal limits  LIPASE, BLOOD  OCCULT BLOOD X 1 CARD TO LAB, STOOL  POC OCCULT BLOOD, ED    EKG None  Radiology CT ABDOMEN PELVIS WO CONTRAST  Result Date: 07/11/2022 CLINICAL DATA:  Left lower quadrant abdominal pain. Rectal bleeding. EXAM: CT ABDOMEN AND PELVIS WITHOUT CONTRAST TECHNIQUE: Multidetector CT imaging of the abdomen and pelvis was performed following the standard protocol without IV contrast. RADIATION DOSE REDUCTION: This exam was performed according to the departmental dose-optimization program which includes automated exposure control, adjustment of the mA and/or kV according to patient size and/or use of iterative reconstruction technique. COMPARISON:  04/05/2021 FINDINGS: Lower chest: Lung bases are clear except for mild chronic scarring in the lingula and right middle lobe. Hepatobiliary: Normal Pancreas: Normal Spleen: Normal Adrenals/Urinary Tract: Adrenal glands are normal. Kidneys are normal. No cyst, mass, stone or hydronephrosis. Bladder is normal. Stomach/Bowel: Stomach and small intestine are normal. Normal appendix. The colon is normal except for very minimal sigmoid diverticulosis without visible diverticulitis. Question mild wall thickening of the distal rectum which could go along with proctocolitis. This is not definite by imaging. Vascular/Lymphatic: Normal Reproductive: Normal Other: No free fluid or air. Musculoskeletal: Lower lumbar degenerative changes. Chronic pars defects at L5 but without slippage. IMPRESSION: No definite acute finding by CT. Minimal sigmoid diverticulosis without visible diverticulitis. Question mild wall thickening of the rectum and anal region suggesting proctocolitis. I do not think this is a definite imaging  finding however. Electronically Signed   By: Paulina Fusi M.D.   On: 07/11/2022 16:05    Procedures Procedures    Medications Ordered in ED Medications - No data to display  ED Course/ Medical Decision Making/ A&P                           Medical Decision Making  Patient with known ulcerative colitis followed closely gastroenterology presents after second episode of bright red blood per rectum.  Discussed differential including hemorrhoid, polyp, diverticular, other.  Patient is well-appearing no significant tenderness on exam.  Blood work reassuring hemoglobin normal electrolytes unremarkable.  CT scan showed mild diverticulosis no signs significant inflammation.  Plan reassessment patient is well-appearing and stable for outpatient follow-up.  Patient is already reached out to his gastroenterologist.  Discussed couple days of his steroid cream and close follow-up and reasons to return.        Final Clinical Impression(s) / ED Diagnoses Final diagnoses:  Diverticulosis  Occult blood in stools    Rx / DC Orders ED Discharge Orders     None         Blane Ohara, MD 07/11/22 2042

## 2022-07-11 NOTE — Discharge Instructions (Signed)
Use your hemorrhoid steroid cream as previously prescribed.  Follow-up closely with your gastroenterologist.  Return for uncontrolled pain, significant bleeding, passing out or new concerns.

## 2022-07-11 NOTE — ED Triage Notes (Signed)
Patient with history of ulcerative colitis here with complaint of bright red rectal bleeding that started a few days ago ago, denies any bloody stools today. Also complains for LLQ abdominal pain.

## 2024-05-22 ENCOUNTER — Encounter: Payer: Self-pay | Admitting: Nurse Practitioner

## 2024-06-06 SURGERY — Surgical Case
Anesthesia: *Unknown
